# Patient Record
Sex: Female | Born: 1960 | Race: White | Hispanic: No | Marital: Single | Smoking: Current every day smoker
Health system: Southern US, Community
[De-identification: ages and names within clinical notes are randomized; demographics above are authoritative.]

## PROBLEM LIST (undated history)

## (undated) DIAGNOSIS — R42 Dizziness and giddiness: Secondary | ICD-10-CM

## (undated) DIAGNOSIS — K219 Gastro-esophageal reflux disease without esophagitis: Secondary | ICD-10-CM

## (undated) DIAGNOSIS — J449 Chronic obstructive pulmonary disease, unspecified: Secondary | ICD-10-CM

## (undated) DIAGNOSIS — G459 Transient cerebral ischemic attack, unspecified: Secondary | ICD-10-CM

## (undated) HISTORY — PX: DILATION AND CURETTAGE OF UTERUS: SHX78

## (undated) HISTORY — PX: BUNIONECTOMY: SHX129

## (undated) HISTORY — PX: TOTAL KNEE ARTHROPLASTY: SHX125

## (undated) HISTORY — PX: JOINT REPLACEMENT: SHX530

## (undated) HISTORY — PX: CERVICAL FUSION: SHX112

## (undated) HISTORY — PX: CERVICAL ABLATION: SHX5771

## (undated) HISTORY — PX: TONSILLECTOMY: SUR1361

---

## 1999-04-08 ENCOUNTER — Encounter: Payer: Self-pay | Admitting: Internal Medicine

## 1999-04-08 ENCOUNTER — Ambulatory Visit (HOSPITAL_COMMUNITY): Admission: RE | Admit: 1999-04-08 | Discharge: 1999-04-08 | Payer: Self-pay | Admitting: Nurse Practitioner

## 1999-08-31 ENCOUNTER — Other Ambulatory Visit: Admission: RE | Admit: 1999-08-31 | Discharge: 1999-08-31 | Payer: Self-pay | Admitting: Obstetrics and Gynecology

## 1999-09-21 ENCOUNTER — Other Ambulatory Visit: Admission: RE | Admit: 1999-09-21 | Discharge: 1999-09-21 | Payer: Self-pay | Admitting: Obstetrics and Gynecology

## 1999-09-21 ENCOUNTER — Encounter (INDEPENDENT_AMBULATORY_CARE_PROVIDER_SITE_OTHER): Payer: Self-pay | Admitting: Specialist

## 1999-11-17 ENCOUNTER — Emergency Department (HOSPITAL_COMMUNITY): Admission: EM | Admit: 1999-11-17 | Discharge: 1999-11-17 | Payer: Self-pay

## 2000-05-17 ENCOUNTER — Ambulatory Visit (HOSPITAL_COMMUNITY): Admission: RE | Admit: 2000-05-17 | Discharge: 2000-05-17 | Payer: Self-pay

## 2000-05-17 ENCOUNTER — Encounter: Payer: Self-pay | Admitting: Obstetrics and Gynecology

## 2002-04-21 ENCOUNTER — Ambulatory Visit (HOSPITAL_COMMUNITY): Admission: RE | Admit: 2002-04-21 | Discharge: 2002-04-21 | Payer: Self-pay | Admitting: Obstetrics and Gynecology

## 2003-03-31 ENCOUNTER — Other Ambulatory Visit: Admission: RE | Admit: 2003-03-31 | Discharge: 2003-03-31 | Payer: Self-pay | Admitting: Obstetrics and Gynecology

## 2003-04-28 ENCOUNTER — Encounter: Payer: Self-pay | Admitting: Neurosurgery

## 2003-04-30 ENCOUNTER — Ambulatory Visit (HOSPITAL_COMMUNITY): Admission: RE | Admit: 2003-04-30 | Discharge: 2003-05-01 | Payer: Self-pay | Admitting: Neurosurgery

## 2003-04-30 ENCOUNTER — Encounter: Payer: Self-pay | Admitting: Neurosurgery

## 2003-06-09 ENCOUNTER — Encounter: Admission: RE | Admit: 2003-06-09 | Discharge: 2003-06-09 | Payer: Self-pay | Admitting: Neurosurgery

## 2003-07-21 ENCOUNTER — Encounter: Admission: RE | Admit: 2003-07-21 | Discharge: 2003-07-21 | Payer: Self-pay | Admitting: Neurosurgery

## 2004-04-06 ENCOUNTER — Other Ambulatory Visit: Admission: RE | Admit: 2004-04-06 | Discharge: 2004-04-06 | Payer: Self-pay | Admitting: Obstetrics and Gynecology

## 2005-04-11 ENCOUNTER — Encounter: Admission: RE | Admit: 2005-04-11 | Discharge: 2005-04-11 | Payer: Self-pay | Admitting: Obstetrics and Gynecology

## 2005-04-25 ENCOUNTER — Encounter: Admission: RE | Admit: 2005-04-25 | Discharge: 2005-04-25 | Payer: Self-pay | Admitting: Obstetrics and Gynecology

## 2005-05-15 ENCOUNTER — Other Ambulatory Visit: Admission: RE | Admit: 2005-05-15 | Discharge: 2005-05-15 | Payer: Self-pay | Admitting: Obstetrics and Gynecology

## 2008-08-01 ENCOUNTER — Emergency Department (HOSPITAL_BASED_OUTPATIENT_CLINIC_OR_DEPARTMENT_OTHER): Admission: EM | Admit: 2008-08-01 | Discharge: 2008-08-01 | Payer: Self-pay | Admitting: Emergency Medicine

## 2010-12-30 NOTE — Op Note (Signed)
   NAME:  ANALEIGHA, NAUMAN                     ACCOUNT NO.:  [id number]   MEDICAL RECORD NO.:  [id number]                   PATIENT TYPE:  AMB   LOCATION:  SDC                                  FACILITY:  [facility]   PHYSICIAN:  Duke Salvia. Marcelle Overlie, M.D.            DATE OF BIRTH:  12/25/1960   DATE OF PROCEDURE:  DATE OF DISCHARGE:                                 OPERATIVE REPORT   PREOPERATIVE DIAGNOSIS:  Menorrhagia.   </POSTOPERATIVE DIAGNOSIS/  Menorrhagia.   PROCEDURE:  Cryoablation.   SURGEON:  Duke Salvia. Marcelle Overlie, M.D.   ANESTHESIA:  Paracervical block plus MAC.   DESCRIPTION OF PROCEDURE:  The patient  was taken to the operating room  after an adequate level of MAC was obtained.  The patient was sedated.  The  legs placed in the stirrups, prepped and draped in the normal fashion for a  vaginal procedure.  The bladder was drained.  Examination under anesthesia  was carried out.  The uterus was mid position, anterior, normal size.  Adnexa negative.  The cervix was grasped with the tenaculum.  A paracervical  block was created.  We will infiltrated at 3 and 9 o'clock submucosally, 5-7  cc of 1% Xylocaine on either side, with negative aspiration.  The uterus  then sounded to 10 cm, progressively dilated to a 29 Pratt.  The cryoprobe  was then positioned in the right cornu at the appropriate depth, and a six  minute free cycle was carried out.  This was thawed.  The cryoprobe was  removed and repositioned into the left cornu without difficulty again at the  appropriate depth, 9-10 cm, and another six minute cryoablation cycle was  performed without complications.  She tolerated this well and went to the  recovery room in good condition.                                               Richard M. Marcelle Overlie, M.D.    RMH/MEDQ  D:  04/21/2002  T:  04/21/2002  Job:  [id number]

## 2010-12-30 NOTE — H&P (Signed)
   NAME:  Tina Herring, Tina Herring                     ACCOUNT NO.:  [id number]   MEDICAL RECORD NO.:  [id number]                   PATIENT TYPE:  AMB   LOCATION:  SDC                                  FACILITY:  [facility]   PHYSICIAN:  Duke Salvia. Marcelle Overlie, M.D.            DATE OF BIRTH:  07-13-1961   DATE OF ADMISSION:  04/21/2002  DATE OF DISCHARGE:                                HISTORY & PHYSICAL   CHIEF COMPLAINT:  Menorrhagia.   HISTORY OF PRESENT ILLNESS:  A 50 year old G 2, P 2, prior tubal ligation  who has a several-year history of heavy irregular bleeding.  In February  2001, she underwent D&C/hysteroscopy.  Pathology showed benign secretory  endometrium.  Hysteroscopy showing a normal cavity at the end of the  procedure.   She continues to have heavy irregular bleeding and some midline dysmenorrhea  during her period.  We had discussed a number of options including a trial  of oral contraceptive pills which she has declined.  She presents now for  cryo ablation.  This procedure including the risks of bleeding, infection,  other complications such as perforation which may require additional surgery  were reviewed.  The 30% to 40% amenorrhea rate, 75% hypomenorrhea rate all  reviewed with her which she understands and accepts.   ALLERGIES:  MORPHINE.   OPERATION:  1. Right knee arthroscopy.  2. Left knee arthroscopic repair.  3. Tonsillectomy.  4. Cesarean section times two.  5. Tubal ligation.  6. Dilatation and curettage.  7. Hysteroscopy.   FAMILY HISTORY:  Family history significant for a sister with breast cancer.  Mother with gastric cancer.  Father with emphysema.  She smokes 1/2 pack per  day.   MEDICATIONS:  No current medicine except for vitamins, calcium and  Wellbutrin.   PHYSICAL EXAMINATION:  Temperature 98.2.  Blood pressure 120/62.  HEENT:  Unremarkable. Neck supple without masses. No sclera.  CARDIOVASCULAR:  Regular rate and rhythm without murmurs, rubs or  gallops.  BREASTS:  Without masses.  ABDOMEN:  Soft, flat and nontender.  PELVIC:  Normal external genitalia.  Cervix clear. Uterus normal size.  Adnexa negative.   IMPRESSION:  Menorrhagia.    PLAN:  Cryo ablation.   Procedure and risks reviewed as above.                                               Richard M. Marcelle Overlie, M.D.    RMH/MEDQ  D:  04/16/2002  T:  04/16/2002  Job:  [id number]

## 2010-12-30 NOTE — Op Note (Signed)
NAME:  Tina Herring, Tina Herring                     ACCOUNT NO.:  [id number]   MEDICAL RECORD NO.:  [id number]                   PATIENT TYPE:  OIB   LOCATION:  3010                                 FACILITY:  [facility]   PHYSICIAN:  Donalee Citrin, M.D.                     DATE OF BIRTH:  [birth year]   DATE OF PROCEDURE:  04/30/2003  DATE OF DISCHARGE:                                 OPERATIVE REPORT   PREOPERATIVE DIAGNOSIS:  Left C7 radiculopathy from large ruptured disk, C6-  7 left.   POSTOPERATIVE DIAGNOSIS:  Left C7 radiculopathy from large ruptured disk, C6-  7 left.   OPERATION PERFORMED:  Anterior cervical diskectomy and fusion at C6-7 using  a 7mm patellar wedge and a 25 mm Atlantis plate.   SURGEON:  Donalee Citrin, M.D.   ASSISTANT:  Tia Alert, MD   ANESTHESIA:  General endotracheal.   INDICATIONS FOR PROCEDURE:  The patient is a very pleasant 50 year old  female who has had longstanding neck and left arm pain radiating down to her  triceps into the first two fingers of her left hand.  The patient has been  refractory to conservative treatment with manipulation and therapy and anti-  inflammatories.  Preoperative imaging showed a very large ruptured disk at  approximately the left C7 nerve root starting preforaminally and extending  into the foramen.  The patient was recommended anterior cervical diskectomy  and fusion.  I extensively went over the risks and benefits of surgery with  her.  She understands and agreed to proceed forward.   DESCRIPTION OF PROCEDURE:  The patient was brought to the operating room was  induced under general anesthesia, placed supine with neck in slight  extension with five pounds of halter traction.  The right side of the neck  was prepped and draped in the usual sterile fashion after preoperative x-ray  localized the C6-7 disk space.  A curvilinear incision was made just off the  midline extended toward the sternocleidomastoid muscle and the  superficial  layer of the platysmas was dissected out and divided longitudinally.  The  avascular plane between the sternocleidomastoid muscle and the strap muscles  was developed down to prevertebral fascia.  The prevertebral fascia was  incised with Kitners.  Then intraoperative x-ray confirmed localization of  the needle in the C5-6 disk space.  Then the longus colli was reflected  laterally from the disk space below this and an annulotomy was made with a  15 blade scalpel.  Pituitary rongeurs were used to remove the anterior  margin of the annulus to mark the disk space.  Then the self-retaining  retractor was placed and the remainder of the interspace was incised.  Parts  of this were noted to be partially calcified.  This was all incised and  removed with pituitary rongeurs.  Then a high speed drill was used to drill  down the disk  space to the posterior annulus and posterior longitudinal  ligament.  There was noted to be a large free fragment disk that migrated  subligamentous and this was teased away with a black nerve hook exposing the  thecal sac and the remainder of the posterior longitudinal ligament was  removed in piecemeal fashion.  The __________ several large fragments of  disk were removed from within the foramen and the C7 neural foramen was  widely opened without difficulty. Then the remainder of the hypertrophied  ligament was removed with 1 and 2 mm Kerrison punches and the right C7 nerve  root was palpated out in its foramen with angled nerve hook noting no  further stenosis.  Then the wound was copiously irrigated.  Meticulous  hemostasis was maintained.  The end plates were scraped and prepared to  receive the bone graft and a 7 mm patellar wedge was sized, selected and  inserted approximately 1 mm deep to the anterior vertebral body line.  Postoperative fluoroscopy confirmed good position of the bone graft.  Then a  25 mm Atlantis plate was sized, selected and  inserted with four 13 mm  variable angle screws which were drilled, tapped and all screws had  excellent purchase.  This was tightened down.  The wound was copiously  irrigated and meticulous hemostasis was maintained.  The platysma was  reapproximated with 3-0 interrupted Vicryl and the skin was closed with  running 4-0 subcuticular.  Benzoin and Steri-Strips were applied.  The  patient was then transferred to the recovery room in stable condition.  At  the end of the case, sponge, needle and instrument counts were correct.                                               Donalee Citrin, M.D.    GC/MEDQ  D:  04/30/2003  T:  04/30/2003  Job:  [id number]

## 2011-05-19 LAB — URINE MICROSCOPIC-ADD ON

## 2011-05-19 LAB — URINALYSIS, ROUTINE W REFLEX MICROSCOPIC
Bilirubin Urine: NEGATIVE
Ketones, ur: NEGATIVE mg/dL
Protein, ur: NEGATIVE mg/dL
Specific Gravity, Urine: 1.024 (ref 1.005–1.030)
pH: 6 (ref 5.0–8.0)

## 2011-05-19 LAB — URINE CULTURE: Colony Count: >=100000

## 2014-04-22 ENCOUNTER — Ambulatory Visit: Payer: Self-pay | Admitting: Family Medicine

## 2015-06-28 ENCOUNTER — Emergency Department (HOSPITAL_COMMUNITY): Payer: Federal, State, Local not specified - PPO

## 2015-06-28 ENCOUNTER — Emergency Department (HOSPITAL_COMMUNITY)
Admission: EM | Admit: 2015-06-28 | Discharge: 2015-06-28 | Disposition: A | Discharge status: Home / Self Care | Payer: Federal, State, Local not specified - PPO | Attending: Emergency Medicine | Admitting: Emergency Medicine

## 2015-06-28 ENCOUNTER — Encounter (HOSPITAL_COMMUNITY): Payer: Self-pay | Admitting: Emergency Medicine

## 2015-06-28 DIAGNOSIS — R1031 Right lower quadrant pain: Secondary | ICD-10-CM | POA: Diagnosis present

## 2015-06-28 DIAGNOSIS — F17200 Nicotine dependence, unspecified, uncomplicated: Secondary | ICD-10-CM | POA: Insufficient documentation

## 2015-06-28 DIAGNOSIS — E663 Overweight: Secondary | ICD-10-CM | POA: Diagnosis not present

## 2015-06-28 DIAGNOSIS — Z8673 Personal history of transient ischemic attack (TIA), and cerebral infarction without residual deficits: Secondary | ICD-10-CM | POA: Diagnosis not present

## 2015-06-28 DIAGNOSIS — Z7982 Long term (current) use of aspirin: Secondary | ICD-10-CM | POA: Diagnosis not present

## 2015-06-28 DIAGNOSIS — K219 Gastro-esophageal reflux disease without esophagitis: Secondary | ICD-10-CM | POA: Insufficient documentation

## 2015-06-28 DIAGNOSIS — Z79899 Other long term (current) drug therapy: Secondary | ICD-10-CM | POA: Insufficient documentation

## 2015-06-28 HISTORY — DX: Transient cerebral ischemic attack, unspecified: G45.9

## 2015-06-28 HISTORY — DX: Gastro-esophageal reflux disease without esophagitis: K21.9

## 2015-06-28 LAB — COMPREHENSIVE METABOLIC PANEL
ALK PHOS: 67 U/L (ref 38–126)
ALT: 29 U/L (ref 14–54)
ANION GAP: 10 (ref 5–15)
AST: 28 U/L (ref 15–41)
Albumin: 4.1 g/dL (ref 3.5–5.0)
BILIRUBIN TOTAL: 0.6 mg/dL (ref 0.3–1.2)
BUN: 16 mg/dL (ref 6–20)
CALCIUM: 9.3 mg/dL (ref 8.9–10.3)
CO2: 26 mmol/L (ref 22–32)
CREATININE: 0.78 mg/dL (ref 0.44–1.00)
Chloride: 104 mmol/L (ref 101–111)
GFR calc non Af Amer: >60 mL/min (ref >60)
Glucose, Bld: 91 mg/dL (ref 65–99)
Potassium: 3.5 mmol/L (ref 3.5–5.1)
Sodium: 140 mmol/L (ref 135–145)
TOTAL PROTEIN: 6.8 g/dL (ref 6.5–8.1)

## 2015-06-28 LAB — CBC WITH DIFFERENTIAL/PLATELET
Basophils Absolute: 0 10*3/uL (ref 0.0–0.1)
Basophils Relative: 0 %
EOS ABS: 0.1 10*3/uL (ref 0.0–0.7)
Eosinophils Relative: 1 %
HEMATOCRIT: 39.2 % (ref 36.0–46.0)
HEMOGLOBIN: 13 g/dL (ref 12.0–15.0)
LYMPHS ABS: 1.3 10*3/uL (ref 0.7–4.0)
LYMPHS PCT: 23 %
MCH: 30.3 pg (ref 26.0–34.0)
MCHC: 33.2 g/dL (ref 30.0–36.0)
MCV: 91.4 fL (ref 78.0–100.0)
MONOS PCT: 10 %
Monocytes Absolute: 0.6 10*3/uL (ref 0.1–1.0)
NEUTROS ABS: 3.7 10*3/uL (ref 1.7–7.7)
NEUTROS PCT: 66 %
Platelets: 224 10*3/uL (ref 150–400)
RBC: 4.29 MIL/uL (ref 3.87–5.11)
RDW: 13.4 % (ref 11.5–15.5)
WBC: 5.6 10*3/uL (ref 4.0–10.5)

## 2015-06-28 LAB — URINALYSIS, ROUTINE W REFLEX MICROSCOPIC
BILIRUBIN URINE: NEGATIVE
Glucose, UA: NEGATIVE mg/dL
HGB URINE DIPSTICK: NEGATIVE
KETONES UR: 15 mg/dL — AB
Leukocytes, UA: NEGATIVE
NITRITE: NEGATIVE
PH: 6 (ref 5.0–8.0)
Protein, ur: NEGATIVE mg/dL
Specific Gravity, Urine: 1.008 (ref 1.005–1.030)
UROBILINOGEN UA: 0.2 mg/dL (ref 0.0–1.0)

## 2015-06-28 LAB — WET PREP, GENITAL
Clue Cells Wet Prep HPF POC: NONE SEEN
TRICH WET PREP: NONE SEEN
YEAST WET PREP: NONE SEEN

## 2015-06-28 MED ORDER — OXYCODONE-ACETAMINOPHEN 5-325 MG PO TABS: 1.0000 | ORAL_TABLET | Freq: Four times a day (QID) | ORAL | Status: DC | PRN

## 2015-06-28 MED ORDER — SODIUM CHLORIDE 0.9 % IV BOLUS (SEPSIS)
1000.0000 mL | Freq: Once | INTRAVENOUS | Status: AC
  Administered 2015-06-28: 1000 mL via INTRAVENOUS

## 2015-06-28 MED ORDER — FENTANYL CITRATE (PF) 100 MCG/2ML IJ SOLN
100.0000 ug | Freq: Once | INTRAMUSCULAR | Status: AC
  Administered 2015-06-28: 100 ug via INTRAVENOUS
  Filled 2015-06-28: qty 2

## 2015-06-28 MED ORDER — ONDANSETRON HCL 4 MG/2ML IJ SOLN
4.0000 mg | Freq: Once | INTRAMUSCULAR | Status: AC
  Administered 2015-06-28: 4 mg via INTRAVENOUS
  Filled 2015-06-28: qty 2

## 2015-06-28 MED ORDER — HYDROMORPHONE HCL 1 MG/ML IJ SOLN
0.5000 mg | Freq: Once | INTRAMUSCULAR | Status: AC
Start: 2015-06-28 — End: 2015-06-28
  Administered 2015-06-28: 0.5 mg via INTRAVENOUS
  Filled 2015-06-28: qty 1

## 2015-06-28 MED ORDER — IOHEXOL 300 MG/ML  SOLN
100.0000 mL | Freq: Once | INTRAMUSCULAR | Status: AC | PRN
  Administered 2015-06-28: 100 mL via INTRAVENOUS

## 2015-06-28 NOTE — ED Notes (Signed)
Pt returned from US

## 2015-06-28 NOTE — ED Notes (Signed)
Pt in US

## 2015-06-28 NOTE — Discharge Instructions (Signed)
You were seen today for abdominal and groin pain. Your ultrasound and CT scan are reassuring. The cause of your pain at this time is unknown. It could be musculoskeletal in nature. He will be given pain medications. If your pain worsened she should be reevaluated.  Abdominal Pain, Adult Many things can cause abdominal pain. Usually, abdominal pain is not caused by a disease and will improve without treatment. It can often be observed and treated at home. Your health care provider will do a physical exam and possibly order blood tests and X-rays to help determine the seriousness of your pain. However, in many cases, more time must pass before a clear cause of the pain can be found. Before that point, your health care provider may not know if you need more testing or further treatment. HOME CARE INSTRUCTIONS Monitor your abdominal pain for any changes. The following actions may help to alleviate any discomfort you are experiencing:  Only take over-the-counter or prescription medicines as directed by your health care provider.  Do not take laxatives unless directed to do so by your health care provider.  Try a clear liquid diet (broth, tea, or water) as directed by your health care provider. Slowly move to a bland diet as tolerated. SEEK MEDICAL CARE IF:  You have unexplained abdominal pain.  You have abdominal pain associated with nausea or diarrhea.  You have pain when you urinate or have a bowel movement.  You experience abdominal pain that wakes you in the night.  You have abdominal pain that is worsened or improved by eating food.  You have abdominal pain that is worsened with eating fatty foods.  You have a fever. SEEK IMMEDIATE MEDICAL CARE IF:  Your pain does not go away within 2 hours.  You keep throwing up (vomiting).  Your pain is felt only in portions of the abdomen, such as the right side or the left lower portion of the abdomen.  You pass bloody or black tarry  stools. MAKE SURE YOU:  Understand these instructions.  Will watch your condition.  Will get help right away if you are not doing well or get worse.   This information is not intended to replace advice given to you by your health care provider. Make sure you discuss any questions you have with your health care provider.   Document Released: 05/10/2005 Document Revised: 04/21/2015 Document Reviewed: 04/09/2013 Elsevier Interactive Patient Education [employer]2016 [employer] Inc[employer]

## 2015-06-28 NOTE — ED Provider Notes (Signed)
CSN: [id number]   Arrival date & time 06/28/15 4098  History  By signing my name below, I, Tina Herring, attest that this documentation has been prepared under the direction and in the presence of Shon Baton, MD. Electronically Signed: Bethel Herring, ED Scribe. 06/28/2015. 4:02 AM.  Chief Complaint  Patient presents with  . Abdominal Pain  . Groin Pain    HPI The history is provided by the patient. No language interpreter was used.   Tina Herring is a 54 y.o. female who presents to the Emergency Department complaining of intermittent right lower quadrant and groin pain with onset yesterday. The pain worsened today around 3 AM and radiates across the groin and lower abdomen. She rates the pain 4/10 in severity noting that it is exacerbated by walking and movement. She used hydrocodone last night around 7 PM. The hydrocodone and 800 mg if ibuprofen the day before provided insufficient relief in pain at home.  Pt denies fever, chills, nausea, vomiting, dysuria, hematuria, difficulty urinating, and increased frequency.  Denies injury.   Past Medical History  Diagnosis Date  . TIA (transient ischemic attack)   . GERD (gastroesophageal reflux disease)     Past Surgical History  Procedure Laterality Date  . Total knee arthroplasty Bilateral   . Tonsillectomy    . Cervical fusion    . Cesarean section    . Bunionectomy    . Cervical ablation    . Dilation and curettage of uterus      No family history on file.  Social History  Substance Use Topics  . Smoking status: Current Every Day Smoker -- 0.25 packs/day    Types: Cigarettes  . Smokeless tobacco: None  . Alcohol Use: No     Review of Systems  Constitutional: Negative for fever and chills.  Gastrointestinal: Positive for abdominal pain. Negative for nausea and vomiting.  Genitourinary: Positive for pelvic pain. Negative for dysuria, hematuria and difficulty urinating.  All other systems reviewed and are  negative.  Home Medications   Prior to Admission medications   Medication Sig Start Date End Date Taking? Authorizing Provider  aspirin 81 MG tablet Take 81 mg by mouth daily.   Yes Historical Provider, MD  GARLIC PO Take 1 tablet by mouth daily.   Yes Historical Provider, MD  Multiple Vitamin (MULTIVITAMIN WITH MINERALS) TABS tablet Take 1 tablet by mouth daily.   Yes Historical Provider, MD  nystatin (MYCOSTATIN/NYSTOP) 100000 UNIT/GM POWD Apply 1 g topically 2 (two) times daily as needed (rash).   Yes Historical Provider, MD  Omega-3 Fatty Acids (FISH OIL) 1000 MG CAPS Take 1,000 mg by mouth daily.   Yes Historical Provider, MD  omeprazole (PRILOSEC) 20 MG capsule Take 20 mg by mouth daily.   Yes Historical Provider, MD  oxyCODONE-acetaminophen (PERCOCET/ROXICET) 5-325 MG tablet Take 1-2 tablets by mouth every 6 (six) hours as needed for severe pain. 06/28/15   Shon Baton, MD    Allergies  Shellfish allergy and Morphine and related  Triage Vitals: BP 135/84 mmHg  Pulse 95  Temp(Src) 98.6 F (37 C) (Oral)  Resp 20  SpO2 99%  Physical Exam  Constitutional: She is oriented to person, place, and time. No distress.  Overweight  HENT:  Head: Normocephalic and atraumatic.  Cardiovascular: Normal rate, regular rhythm and normal heart sounds.   No murmur heard. Pulmonary/Chest: Effort normal and breath sounds normal. No respiratory distress. She has no wheezes.  Abdominal: Soft. Bowel sounds are normal. There  is tenderness. There is no rebound and no guarding.  Tenderness to palpation in the right lower quadrant, no rebound or guarding, pain extends into the right inguinal area, negative Rovsing's  Genitourinary: Vagina normal.  Pain with introduction of speculum, right adnexal tenderness without mass, no cervical motion tenderness scant vaginal discharge  Musculoskeletal: Normal range of motion. She exhibits no edema.  Normal range of motion of the right hip  Neurological:  She is alert and oriented to person, place, and time.  Skin: Skin is warm and dry.  Psychiatric: She has a normal mood and affect.  Nursing note and vitals reviewed.   ED Course  Procedures   DIAGNOSTIC STUDIES: Oxygen Saturation is 99% on RA, normal by my interpretation.    COORDINATION OF CARE: 3:58 AM Discussed treatment plan which includes lab work and pain management with pt at bedside and pt agreed to plan.  Labs Reviewed  WET PREP, GENITAL - Abnormal; Notable for the following:    WBC, Wet Prep HPF POC FEW (*)    All other components within normal limits  URINALYSIS, ROUTINE W REFLEX MICROSCOPIC (NOT AT [facility]) - Abnormal; Notable for the following:    Ketones, ur 15 (*)    All other components within normal limits  CBC WITH DIFFERENTIAL/PLATELET  COMPREHENSIVE METABOLIC PANEL    Imaging Review US Transvaginal Non-ob  06/28/2015  CLINICAL DATA:  Right lower quadrant pain. EXAM: TRANSABDOMINAL AND TRANSVAGINAL ULTRASOUND OF PELVIS DOPPLER ULTRASOUND OF OVARIES TECHNIQUE: Both transabdominal and transvaginal ultrasound examinations of the pelvis were performed. Transabdominal technique was performed for global imaging of the pelvis including uterus, ovaries, adnexal regions, and pelvic cul-de-sac. It was necessary to proceed with endovaginal exam following the transabdominal exam to visualize the ovaries. Color and duplex Doppler ultrasound was utilized to evaluate blood flow to the ovaries. COMPARISON:  CT scan of the abdomen and pelvis dated 06/28/2015 FINDINGS: Uterus Measurements: 4.5 x 2.6 x 3.7 cm. No fibroids or other mass visualized. Endometrium Thickness: 2.3 mm.  No focal abnormality visualized. Right ovary Measurements: 1.9 x 1.7 x 0.4 cm. Normal appearance/no adnexal mass. Left ovary Not visualized. The ovary appears normal on the CT scan done earlier on the same date. Pulsed Doppler evaluation of the right ovary demonstrates normal low resistance arterial and venous  perfusion. Other findings No free fluid. IMPRESSION: Normal uterus and right ovary with normal perfusion to the right ovary. Left ovary was not identified. Electronically Signed   By: Francene Boyers M.D.   On: 06/28/2015 07:43   US Pelvis Complete  06/28/2015  CLINICAL DATA:  Right lower quadrant pain. EXAM: TRANSABDOMINAL AND TRANSVAGINAL ULTRASOUND OF PELVIS DOPPLER ULTRASOUND OF OVARIES TECHNIQUE: Both transabdominal and transvaginal ultrasound examinations of the pelvis were performed. Transabdominal technique was performed for global imaging of the pelvis including uterus, ovaries, adnexal regions, and pelvic cul-de-sac. It was necessary to proceed with endovaginal exam following the transabdominal exam to visualize the ovaries. Color and duplex Doppler ultrasound was utilized to evaluate blood flow to the ovaries. COMPARISON:  CT scan of the abdomen and pelvis dated 06/28/2015 FINDINGS: Uterus Measurements: 4.5 x 2.6 x 3.7 cm. No fibroids or other mass visualized. Endometrium Thickness: 2.3 mm.  No focal abnormality visualized. Right ovary Measurements: 1.9 x 1.7 x 0.4 cm. Normal appearance/no adnexal mass. Left ovary Not visualized. The ovary appears normal on the CT scan done earlier on the same date. Pulsed Doppler evaluation of the right ovary demonstrates normal low resistance arterial and venous perfusion.  Other findings No free fluid. IMPRESSION: Normal uterus and right ovary with normal perfusion to the right ovary. Left ovary was not identified. Electronically Signed   By: Francene Boyers M.D.   On: 06/28/2015 07:43   Ct Abdomen Pelvis W Contrast  06/28/2015  CLINICAL DATA:  Right lower abdominal pain radiating to right flank. Onset yesterday, progressive. EXAM: CT ABDOMEN AND PELVIS WITH CONTRAST TECHNIQUE: Multidetector CT imaging of the abdomen and pelvis was performed using the standard protocol following bolus administration of intravenous contrast. CONTRAST:  OMNIPAQUE IOHEXOL 300  MG/ML  SOLN COMPARISON:  None. FINDINGS: Lower chest:  The included lung bases are clear. Liver: Decreased density consistent with steatosis. No focal lesion. Hepatobiliary: The gallbladder physiologically distended, no calcified stone. No pericholecystic inflammatory change. No biliary dilatation. Pancreas: Normal. Spleen: Normal. Adrenal glands: No nodule. Kidneys: Symmetric renal enhancement and excretion. No hydronephrosis. Extrarenal pelvis configuration of the right kidney, no hydronephrosis. No focal renal abnormality. Stomach/Bowel: Stomach physiologically distended. There are no dilated or thickened small bowel loops. Diverticulosis throughout the mid and distal colon, with large diverticular burden in the sigmoid colon. No diverticulitis. Small volume of stool throughout the colon without colonic wall thickening. The appendix is normal. Vascular/Lymphatic: No retroperitoneal adenopathy. Abdominal aorta is normal in caliber. Reproductive: Uterus and adnexa normal for age. Bladder: Physiologically distended. Minimal perivesicular stranding about the dome. Wall thickening. Other: Small fat containing umbilical hernia. Musculoskeletal: There are no acute or suspicious osseous abnormalities. Facet arthropathy in the lower lumbar spine. Degenerative disc disease at L5-S1 and L4-L5. IMPRESSION: 1. No acute abnormality in the abdomen/pelvis. 2. Multifocal colonic diverticulosis with large diverticular burden in the sigmoid colon. No diverticulitis. 3. Incidental finding of hepatic steatosis. Electronically Signed   By: Rubye Oaks M.D.   On: 06/28/2015 06:38   [place] Art/ven Flow Abd Pelv Doppler  06/28/2015  CLINICAL DATA:  Right lower quadrant pain. EXAM: TRANSABDOMINAL AND TRANSVAGINAL ULTRASOUND OF PELVIS DOPPLER ULTRASOUND OF OVARIES TECHNIQUE: Both transabdominal and transvaginal ultrasound examinations of the pelvis were performed. Transabdominal technique was performed for global imaging of the pelvis  including uterus, ovaries, adnexal regions, and pelvic cul-de-sac. It was necessary to proceed with endovaginal exam following the transabdominal exam to visualize the ovaries. Color and duplex Doppler ultrasound was utilized to evaluate blood flow to the ovaries. COMPARISON:  CT scan of the abdomen and pelvis dated 06/28/2015 FINDINGS: Uterus Measurements: 4.5 x 2.6 x 3.7 cm. No fibroids or other mass visualized. Endometrium Thickness: 2.3 mm.  No focal abnormality visualized. Right ovary Measurements: 1.9 x 1.7 x 0.4 cm. Normal appearance/no adnexal mass. Left ovary Not visualized. The ovary appears normal on the CT scan done earlier on the same date. Pulsed Doppler evaluation of the right ovary demonstrates normal low resistance arterial and venous perfusion. Other findings No free fluid. IMPRESSION: Normal uterus and right ovary with normal perfusion to the right ovary. Left ovary was not identified. Electronically Signed   By: Francene Boyers M.D.   On: 06/28/2015 07:43    I personally reviewed and evaluated these lab results as a part of my medical decision-making.   MDM   Final diagnoses:  RLQ abdominal pain    Patient presents with right lower quadrant and right groin pain. Ongoing and worsening over the last 24 hours. No other significant associated symptoms. She is tender in her right lower quadrant and right groin. Considerations include appendicitis, ovarian pathology, or musculoskeletal. Patient appears uncomfortable. She was given pain and nausea medication.  Basic lab results obtained and largely reassuring including urinalysis. CT scan of the abdomen obtained to rule out appendicitis. This is negative. Patient continues to endorse increasing pain. Pelvic exam with tenderness over the right adnexa. Ovarian torsion would be a consideration. Ultrasound obtained and reassuring.  Discussed the result with the patient. At this time, with normal imaging her pain may be musculoskeletal in nature.  Given her age, absence of known injury, and tenderness into the abdomen, would have lower suspicion for occult hip fracture; however, discussed with the patient if her pain progresses or worsens she needs to be reevaluated. Will be discharged with a short course of pain medication. Patient was given strict return cautions.  After history, exam, and medical workup I feel the patient has been appropriately medically screened and is safe for discharge home. Pertinent diagnoses were discussed with the patient. Patient was given return precautions.  I personally performed the services described in this documentation, which was scribed in my presence. The recorded information has been reviewed and is accurate.    Shon Batonourtney F Emberlie Gotcher, MD 06/29/15 [phone]

## 2015-06-28 NOTE — ED Notes (Signed)
Pt arrives with c/o R lower abdominal pain radiating to the R flank, woke up with it yesterday and got better throughout the day, then got worse after she went to bed. This morning pt states she could barely walk d/t pain. Denies urinary s/s.

## 2016-09-02 ENCOUNTER — Emergency Department (HOSPITAL_BASED_OUTPATIENT_CLINIC_OR_DEPARTMENT_OTHER)
Admission: EM | Admit: 2016-09-02 | Discharge: 2016-09-02 | Disposition: A | Discharge status: Home / Self Care | Attending: Emergency Medicine | Admitting: Emergency Medicine

## 2016-09-02 ENCOUNTER — Encounter (HOSPITAL_BASED_OUTPATIENT_CLINIC_OR_DEPARTMENT_OTHER): Payer: Self-pay | Admitting: *Deleted

## 2016-09-02 ENCOUNTER — Emergency Department (HOSPITAL_BASED_OUTPATIENT_CLINIC_OR_DEPARTMENT_OTHER)

## 2016-09-02 DIAGNOSIS — Y999 Unspecified external cause status: Secondary | ICD-10-CM | POA: Insufficient documentation

## 2016-09-02 DIAGNOSIS — W010XXA Fall on same level from slipping, tripping and stumbling without subsequent striking against object, initial encounter: Secondary | ICD-10-CM | POA: Insufficient documentation

## 2016-09-02 DIAGNOSIS — S6391XA Sprain of unspecified part of right wrist and hand, initial encounter: Secondary | ICD-10-CM | POA: Diagnosis not present

## 2016-09-02 DIAGNOSIS — Z7982 Long term (current) use of aspirin: Secondary | ICD-10-CM | POA: Diagnosis not present

## 2016-09-02 DIAGNOSIS — S6392XA Sprain of unspecified part of left wrist and hand, initial encounter: Secondary | ICD-10-CM | POA: Diagnosis not present

## 2016-09-02 DIAGNOSIS — Z79899 Other long term (current) drug therapy: Secondary | ICD-10-CM | POA: Insufficient documentation

## 2016-09-02 DIAGNOSIS — Y92481 Parking lot as the place of occurrence of the external cause: Secondary | ICD-10-CM | POA: Diagnosis not present

## 2016-09-02 DIAGNOSIS — Y9389 Activity, other specified: Secondary | ICD-10-CM | POA: Diagnosis not present

## 2016-09-02 DIAGNOSIS — S6991XA Unspecified injury of right wrist, hand and finger(s), initial encounter: Secondary | ICD-10-CM | POA: Diagnosis present

## 2016-09-02 DIAGNOSIS — F17210 Nicotine dependence, cigarettes, uncomplicated: Secondary | ICD-10-CM | POA: Insufficient documentation

## 2016-09-02 DIAGNOSIS — W19XXXA Unspecified fall, initial encounter: Secondary | ICD-10-CM

## 2016-09-02 MED ORDER — DICLOFENAC SODIUM 50 MG PO TBEC
50.0000 mg | DELAYED_RELEASE_TABLET | Freq: Two times a day (BID) | ORAL | 0 refills | Status: DC
Start: 2016-09-02 — End: 2017-05-09

## 2016-09-02 NOTE — ED Triage Notes (Signed)
Patient states she slipped coming out of work yesterday on the salt on the sidewalk.  Landed on her bilateral hands.  Today has bilateral wrist, hand and left arm pain.  No LOC.

## 2016-09-02 NOTE — ED Provider Notes (Signed)
[facility] Provider Note   CSN: [id number] Arrival date & time: 09/02/16  1138  By signing my name below, I, Tina Herring, attest that this documentation has been prepared under the direction and in the presence of  [facility] M. Damian Leavell, NP. Electronically Signed: Doreatha Herring, ED Scribe. 09/02/16. 4:14 PM.    History   Chief Complaint Chief Complaint  Patient presents with  . Fall    HPI Tina Herring is a 56 y.o. female who presents to the Emergency Department complaining of moderate right forearm and wrist pain s/p mechanical fall that occurred yesterday. Pt states she slipped on salt on the parking lot, fell forward and landed on her hands and knees. Pt denies LOC, head injury. She also notes a tingling sensation from her right thumb to her forearm that began after the injury. She states her pain is worsened with wrist movement. No alleviating factors noted. She denies ankle pain, additional injuries or complaints.   The history is provided by the patient. No language interpreter was used.  Fall  This is a new problem. The current episode started yesterday. The problem occurs constantly. The problem has not changed since onset.Pertinent negatives include no headaches. The symptoms are aggravated by bending. Nothing relieves the symptoms. She has tried nothing for the symptoms. The treatment provided no relief.    Past Medical History:  Diagnosis Date  . GERD (gastroesophageal reflux disease)   . TIA (transient ischemic attack)     There are no active problems to display for this patient.   Past Surgical History:  Procedure Laterality Date  . BUNIONECTOMY    . CERVICAL ABLATION    . CERVICAL FUSION    . CESAREAN SECTION    . DILATION AND CURETTAGE OF UTERUS    . JOINT REPLACEMENT Bilateral   . TONSILLECTOMY    . TOTAL KNEE ARTHROPLASTY Bilateral     OB History    No data available       Home Medications    Prior to Admission medications   Medication  Sig Start Date End Date Taking? Authorizing Provider  aspirin 81 MG tablet Take 81 mg by mouth daily.   Yes Historical Provider, MD  Fexofenadine HCl (ALLEGRA PO) Take by mouth.   Yes Historical Provider, MD  GARLIC PO Take 1 tablet by mouth daily.   Yes Historical Provider, MD  Multiple Vitamin (MULTIVITAMIN WITH MINERALS) TABS tablet Take 1 tablet by mouth daily.   Yes Historical Provider, MD  nystatin (MYCOSTATIN/NYSTOP) 100000 UNIT/GM POWD Apply 1 g topically 2 (two) times daily as needed (rash).   Yes Historical Provider, MD  Omega-3 Fatty Acids (FISH OIL) 1000 MG CAPS Take 1,000 mg by mouth daily.   Yes Historical Provider, MD  omeprazole (PRILOSEC) 20 MG capsule Take 20 mg by mouth daily.   Yes Historical Provider, MD  diclofenac (VOLTAREN) 50 MG EC tablet Take 1 tablet (50 mg total) by mouth 2 (two) times daily. 09/02/16   Hope Orlene Och, NP  oxyCODONE-acetaminophen (PERCOCET/ROXICET) 5-325 MG tablet Take 1-2 tablets by mouth every 6 (six) hours as needed for severe pain. 06/28/15   Shon Baton, MD    Family History No family history on file.  Social History Social History  Substance Use Topics  . Smoking status: Current Every Day Smoker    Packs/day: 0.25    Types: Cigarettes  . Smokeless tobacco: Never Used  . Alcohol use No     Allergies   Shellfish  allergy and Morphine and related   Review of Systems Review of Systems  Musculoskeletal: Positive for arthralgias.  Neurological: Positive for numbness. Negative for syncope and headaches.  All other systems reviewed and are negative.    Physical Exam Updated Vital Signs BP 129/64 (BP Location: Left Arm)   Pulse 66   Temp 98 F (36.7 C) (Oral)   Resp 18   Ht 5\' 3"  (1.6 m)   Wt 93.9 kg   SpO2 98%   BMI 36.67 kg/m   Physical Exam  Constitutional: She appears well-developed and well-nourished.  HENT:  Head: Normocephalic.  Eyes: Conjunctivae are normal.  Cardiovascular: Normal rate.   Radial pulses 2+  Adequate circulation.   Pulmonary/Chest: Effort normal. No respiratory distress.  Abdominal: She exhibits no distension.  Musculoskeletal: Normal range of motion. She exhibits tenderness.  Tenderness over the thenar area that radiated to the wrist of the RUE. FROM of the right digits. FROM and no tenderness on the left.   Neurological: She is alert.  Grips are equal.   Skin: Skin is warm and dry.  Ecchymosis to the right forearm   Psychiatric: She has a normal mood and affect. Her behavior is normal.  Nursing note and vitals reviewed.    ED Treatments / Results   DIAGNOSTIC STUDIES: Oxygen Saturation is 98% on RA, normal by my interpretation.    COORDINATION OF CARE: 4:10 PM Discussed treatment plan with pt at bedside which includes XR and pt agreed to plan.    Labs (all labs ordered are listed, but only abnormal results are displayed) Labs Reviewed - No data to display   Radiology Dg Wrist Complete Left  Result Date: 09/02/2016 CLINICAL DATA:  Pain after fall EXAM: LEFT WRIST - COMPLETE 3+ VIEW COMPARISON:  None. 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 Minimal soft tissue calcification in the triangular cartilage, less than seen on the right. There may be minimal soft tissue calcification along the dorsum of the wrist as well, also less than seen on the right. No acute fractures are seen on today's study. IMPRESSION: Soft tissue calcifications in the wrist as above, similar in distribution but less in quantity compared to the right. This raises the possibility of CPPD. No acute fractures identified. Electronically Signed   By: Gerome Samavid  Williams III M.D   On: 09/02/2016 15:34   Dg Wrist Complete Right  Result Date: 09/02/2016 CLINICAL DATA:  Pain after trauma EXAM: RIGHT WRIST - COMPLETE 3+ VIEW COMPARISON:  None. FINDINGS: Soft tissue calcifications are seen in the triangular cartilage distal to the  ulna and along the dorsal aspect of the carpal bones raising the possibility of CPPD. No acute fractures are identified. IMPRESSION: Soft tissue calcifications as above, possibly representing sequela of CPPD. No acute fractures noted. Electronically Signed   By: Gerome Samavid  Williams III M.D   On: 09/02/2016 15:32   Dg Hand Complete Right  Result Date: 09/02/2016 CLINICAL DATA:  Right hand pain. EXAM: RIGHT HAND - COMPLETE 3+ VIEW COMPARISON:  None. FINDINGS: Soft tissue calcification is seen posterior to the carpal bones on the lateral view. This is not the typical appearance of a triquetrum fracture. There is also soft tissue calcification in the triangular cartilage distal to the ulna. Lucencies in the capitate are likely bone cysts. No carpal bone fractures are identified. The remainder of the hand is normal in appearance. IMPRESSION: 1. Soft tissue calcifications posterior to the carpal bones  on the lateral view and in the triangular cartilage distal to the ulna are nonspecific but may be seen with CPPD. The calcification posterior to the carpal bones is not typical in appearance for a triquetrum fracture. No acute abnormality identified. Electronically Signed   By: Gerome Sam III M.D   On: 09/02/2016 15:29    Procedures Procedures (including critical care time)  Medications Ordered in ED Medications - No data to display   Initial Impression / Assessment and Plan / ED Course  I have reviewed the triage vital signs and the nursing notes.  Pertinent labs & imaging results that were available during my care of the patient were reviewed by me and considered in my medical decision making (see chart for details).  Patient X-Ray negative for obvious fracture or dislocation.  Pt advised to follow up with hand surgery. Patient given velcro thumb spica while in ED, conservative therapy recommended and discussed. Patient will be discharged home & is agreeable with above plan. Returns precautions  discussed. Pt appears safe for discharge.   Final Clinical Impressions(s) / ED Diagnoses   Final diagnoses:  Hand sprain, right, initial encounter  Hand sprain, left, initial encounter  Fall, initial encounter    New Prescriptions Discharge Medication List as of 09/02/2016  4:17 PM    START taking these medications   Details  diclofenac (VOLTAREN) 50 MG EC tablet Take 1 tablet (50 mg total) by mouth 2 (two) times daily., Starting Sat 09/02/2016, Print       I personally performed the services described in this documentation, which was scribed in my presence. The recorded information has been reviewed and is accurate.    [address] [place], [place] 09/03/16 1610    Rolan Bucco, MD 09/03/16 [phone]

## 2017-05-09 ENCOUNTER — Encounter: Payer: Self-pay | Admitting: Podiatry

## 2017-05-09 ENCOUNTER — Ambulatory Visit (INDEPENDENT_AMBULATORY_CARE_PROVIDER_SITE_OTHER): Payer: 59 | Admitting: Podiatry

## 2017-05-09 VITALS — BP 130/74 | HR 81 | Ht 63.0 in | Wt 212.0 lb

## 2017-05-09 DIAGNOSIS — M6700 Short Achilles tendon (acquired), unspecified ankle: Secondary | ICD-10-CM | POA: Diagnosis not present

## 2017-05-09 DIAGNOSIS — M722 Plantar fascial fibromatosis: Secondary | ICD-10-CM

## 2017-05-09 DIAGNOSIS — M216X9 Other acquired deformities of unspecified foot: Secondary | ICD-10-CM | POA: Diagnosis not present

## 2017-05-09 DIAGNOSIS — G8929 Other chronic pain: Secondary | ICD-10-CM

## 2017-05-09 DIAGNOSIS — M25571 Pain in right ankle and joints of right foot: Secondary | ICD-10-CM | POA: Diagnosis not present

## 2017-05-09 NOTE — Progress Notes (Signed)
     SUBJECTIVE: 56 y.o. year old female presents complaining of right foot pain at instep area x 1 week, right ankle pain x 8 months. Was treated with injection on right lateral ankle by orthopedist last November 2017. Having shooting pain like electricity going through from lateral ankle to anterior ankle of right foot. After been on feet all day, the foot becomes tingle and numbness on right foot more on instep area and also top and bottom of forefoot. Sits at work and on feet once gets home. She was referred by her husband and son.  REVIEW OF SYSTEMS: A comprehensive review of systems was negative except for: Knee replacement 2009 on right and 2015 on left. Right bunion surgery 15 years ago.  OBJECTIVE: DERMATOLOGIC EXAMINATION: No abnormal skin lesions. Thin mild keratotic skin under 4th MPJ bilateral.  VASCULAR EXAMINATION OF LOWER LIMBS: All pedal pulses are palpable with normal pulsation.  Capillary Filling times within 3 seconds in all digits.  Mild ankle edema right lateral ankle. Temperature gradient from tibial crest to dorsum of foot is within normal bilateral.  NEUROLOGIC EXAMINATION OF THE LOWER LIMBS: All epicritic and tactile sensations grossly intact. Sharp and Dull discriminatory sensations at the plantar ball of hallux is intact bilateral.   MUSCULOSKELETAL EXAMINATION: Positive for tight Achilles tendon bilateral, weak first metatarsal bone, compensatory hyperpronation right ankle.  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 Unstable first ray bilateral. Compensatory hyperpronation right. Plantar fasciitis right.  PLAN: Reviewed clinical findings and available treatment options. Reviewed stretch exercise to practice daily for tight Achilles tendon bilateral. May benefit from custom orthotics. May use ankle brace as needed. Return for orthotics.

## 2017-05-09 NOTE — Patient Instructions (Signed)
Seen for pain on right foot and ankle. Noted of tight Achilles tendon, unstable first metatarsal and compensatory excess pronation of STJ right foot. Need daily stretch exercise for tight Achille tendon as demonstrated. Need custom orthotics. May take time off as needed and take anti inflammatory medication as needed.

## 2017-05-15 ENCOUNTER — Ambulatory Visit (INDEPENDENT_AMBULATORY_CARE_PROVIDER_SITE_OTHER): Payer: 59 | Admitting: Podiatry

## 2017-05-15 DIAGNOSIS — G8929 Other chronic pain: Secondary | ICD-10-CM

## 2017-05-15 DIAGNOSIS — M216X9 Other acquired deformities of unspecified foot: Secondary | ICD-10-CM

## 2017-05-15 DIAGNOSIS — M722 Plantar fascial fibromatosis: Secondary | ICD-10-CM

## 2017-05-15 DIAGNOSIS — M25571 Pain in right ankle and joints of right foot: Secondary | ICD-10-CM

## 2017-05-15 DIAGNOSIS — M6700 Short Achilles tendon (acquired), unspecified ankle: Secondary | ICD-10-CM

## 2017-05-15 NOTE — Patient Instructions (Addendum)
Both feet casted for orthotics. Still having pain on right lateral ankle and plantar fascia. May benefit from a new ankle brace. One ankle brace dispensed with instruction. Will call when they are ready.

## 2017-05-15 NOTE — Progress Notes (Signed)
SUBJECTIVE: 56 y.o. year old female presents complaining of pain on right lateral ankle and instep area.  HPI: Right foot pain at instep area x 2-3 week, right ankle pain over 8 months. Was treated with injection on right lateral ankle by orthopedist last November 2017. Having shooting pain like electricity going through from lateral ankle to anterior ankle of right foot. After been on feet all day, the foot becomes tingle and numbness on right foot more on instep area and also top and bottom of forefoot. Sits at work and on feet once gets home. She was referred by her husband and son.  REVIEW OF SYSTEMS: A comprehensive review of systems was negative except for: Knee replacement 2009 on right and 2015 on left. Right bunion surgery 15 years ago.  OBJECTIVE: DERMATOLOGIC EXAMINATION: No abnormal skin lesions. Thin mild keratotic skin under 4th MPJ bilateral.  VASCULAR EXAMINATION OF LOWER LIMBS: All pedal pulses are palpable with normal pulsation.  Capillary Filling times within 3 seconds in all digits.  Mild ankle edema right lateral ankle. Temperature gradient from tibial crest to dorsum of foot is within normal bilateral.  NEUROLOGIC EXAMINATION OF THE LOWER LIMBS: All epicritic and tactile sensations grossly intact. Sharp and Dull discriminatory sensations at the plantar ball of hallux is intact bilateral.   MUSCULOSKELETAL EXAMINATION: Positive for tight Achilles tendon bilateral, weak first metatarsal bone, compensatory hyperpronation right ankle.  Radiographic examination reveal old surgery at metatarsal head with a fixation pain. High arched foot with plantar calcaneal spur right. Elevated first ray noted.  ASSESSMENT: Tight Achilles tendon bilateral. Sinus tarsitis from hyperpronation RL>. Unstable first ray bilateral leading to hyperpronation of STJ bilateral. Symptomatic compensatory hyperpronation right. Unresolved plantar fasciitis right.  PLAN: Reviewed  clinical findings and available treatment options. Proper shoe gear reviewed.  Both feet casted for custom orthotics. New ankle brace dispensed.

## 2017-05-17 ENCOUNTER — Encounter: Payer: Self-pay | Admitting: Podiatry

## 2017-07-12 ENCOUNTER — Encounter: Payer: Self-pay | Admitting: Podiatry

## 2017-07-12 ENCOUNTER — Ambulatory Visit (INDEPENDENT_AMBULATORY_CARE_PROVIDER_SITE_OTHER): Payer: 59 | Admitting: Podiatry

## 2017-07-12 DIAGNOSIS — M722 Plantar fascial fibromatosis: Secondary | ICD-10-CM

## 2017-07-12 DIAGNOSIS — M216X9 Other acquired deformities of unspecified foot: Secondary | ICD-10-CM

## 2017-07-12 NOTE — Progress Notes (Signed)
Stated that she feels a lot better. Orthotics are making her feet itch. Pain is much less. Ankle pain is much better and pretty much gone. Another question is if orthotics can be worn with other shoes other than sneakers. If itching continues, will replace top cover with different material.

## 2017-07-12 NOTE — Patient Instructions (Signed)
1 month follow up on orthotics. Doing well. Ankle pain has subsided. Having itching feeling at the bottom of forefoot. Will change the liner if feeling continues.

## 2018-06-16 ENCOUNTER — Other Ambulatory Visit: Payer: Self-pay

## 2018-06-16 ENCOUNTER — Emergency Department (HOSPITAL_COMMUNITY)
Admission: EM | Admit: 2018-06-16 | Discharge: 2018-06-16 | Disposition: A | Discharge status: Home / Self Care | Payer: 59 | Attending: Emergency Medicine | Admitting: Emergency Medicine

## 2018-06-16 ENCOUNTER — Emergency Department (HOSPITAL_COMMUNITY): Payer: 59

## 2018-06-16 DIAGNOSIS — R1031 Right lower quadrant pain: Secondary | ICD-10-CM

## 2018-06-16 DIAGNOSIS — Z79899 Other long term (current) drug therapy: Secondary | ICD-10-CM | POA: Insufficient documentation

## 2018-06-16 DIAGNOSIS — F17210 Nicotine dependence, cigarettes, uncomplicated: Secondary | ICD-10-CM | POA: Insufficient documentation

## 2018-06-16 DIAGNOSIS — K8020 Calculus of gallbladder without cholecystitis without obstruction: Secondary | ICD-10-CM

## 2018-06-16 DIAGNOSIS — R109 Unspecified abdominal pain: Secondary | ICD-10-CM | POA: Diagnosis present

## 2018-06-16 DIAGNOSIS — Z7982 Long term (current) use of aspirin: Secondary | ICD-10-CM | POA: Insufficient documentation

## 2018-06-16 LAB — BASIC METABOLIC PANEL
ANION GAP: 8 (ref 5–15)
BUN: 18 mg/dL (ref 6–20)
CALCIUM: 9.1 mg/dL (ref 8.9–10.3)
CO2: 26 mmol/L (ref 22–32)
Chloride: 104 mmol/L (ref 98–111)
Creatinine, Ser: 0.96 mg/dL (ref 0.44–1.00)
GFR calc Af Amer: >60 mL/min (ref >60)
GLUCOSE: 108 mg/dL — AB (ref 70–99)
Potassium: 3.4 mmol/L — ABNORMAL LOW (ref 3.5–5.1)
Sodium: 138 mmol/L (ref 135–145)

## 2018-06-16 LAB — URINALYSIS, ROUTINE W REFLEX MICROSCOPIC
Bilirubin Urine: NEGATIVE
GLUCOSE, UA: NEGATIVE mg/dL
Hgb urine dipstick: NEGATIVE
KETONES UR: NEGATIVE mg/dL
Leukocytes, UA: NEGATIVE
Nitrite: NEGATIVE
PROTEIN: NEGATIVE mg/dL
Specific Gravity, Urine: >1.046 — ABNORMAL HIGH (ref 1.005–1.030)
pH: 6 (ref 5.0–8.0)

## 2018-06-16 LAB — CBC
HCT: 45.5 % (ref 36.0–46.0)
Hemoglobin: 14.6 g/dL (ref 12.0–15.0)
MCH: 30 pg (ref 26.0–34.0)
MCHC: 32.1 g/dL (ref 30.0–36.0)
MCV: 93.4 fL (ref 80.0–100.0)
PLATELETS: 304 10*3/uL (ref 150–400)
RBC: 4.87 MIL/uL (ref 3.87–5.11)
RDW: 12.9 % (ref 11.5–15.5)
WBC: 7.4 10*3/uL (ref 4.0–10.5)
nRBC: 0 % (ref 0.0–0.2)

## 2018-06-16 MED ORDER — ONDANSETRON HCL 4 MG/2ML IJ SOLN
4.0000 mg | Freq: Once | INTRAMUSCULAR | Status: AC
  Administered 2018-06-16: 4 mg via INTRAVENOUS
  Filled 2018-06-16: qty 2

## 2018-06-16 MED ORDER — IOHEXOL 300 MG/ML  SOLN
100.0000 mL | Freq: Once | INTRAMUSCULAR | Status: AC | PRN
  Administered 2018-06-16: 100 mL via INTRAVENOUS

## 2018-06-16 MED ORDER — KETOROLAC TROMETHAMINE 30 MG/ML IJ SOLN
15.0000 mg | Freq: Once | INTRAMUSCULAR | Status: AC
  Administered 2018-06-16: 15 mg via INTRAVENOUS
  Filled 2018-06-16: qty 1

## 2018-06-16 MED ORDER — FENTANYL CITRATE (PF) 100 MCG/2ML IJ SOLN
50.0000 ug | Freq: Once | INTRAMUSCULAR | Status: AC
  Administered 2018-06-16: 50 ug via INTRAVENOUS
  Filled 2018-06-16: qty 2

## 2018-06-16 MED ORDER — SODIUM CHLORIDE 0.9 % IV BOLUS
500.0000 mL | Freq: Once | INTRAVENOUS | Status: AC
  Administered 2018-06-16: 500 mL via INTRAVENOUS

## 2018-06-16 NOTE — ED Notes (Signed)
Pt discharged from ED; instructions provided and scripts given; Pt encouraged to return to ED if symptoms worsen and to f/u with PCP; Pt verbalized understanding of all instructions 

## 2018-06-16 NOTE — ED Triage Notes (Signed)
Patient c/o right sided flank pain as well as right lower back and right lower abd that began about 4 hours ago. States she also feels nauseas.

## 2018-06-16 NOTE — Discharge Instructions (Addendum)
Followup with your doctor as needed

## 2018-06-16 NOTE — ED Provider Notes (Signed)
[facility] EMERGENCY DEPARTMENT Provider Note   CSN: [id number] Arrival date & time: 06/16/18  4098     History   Chief Complaint Chief Complaint  Patient presents with  . Flank Pain    right    HPI Tina Herring is a 57 y.o. female.  HPI Patient presents with right flank pain.  Goes to the back and abdomen.  Began around 4 hours ago.  Decreased appetite.  No dysuria.  No vaginal bleeding or discharge.  No diarrhea or constipation.  States she is on Augmentin for bronchitis.  Has had some coughing.  No rash.  Pain is worse with movement but does have a difficult time finding comfortable position.  Pain is sharp. Past Medical History:  Diagnosis Date  . GERD (gastroesophageal reflux disease)   . TIA (transient ischemic attack)     Patient Active Problem List   Diagnosis Date Noted  . Ankle pain, right 05/09/2017    Past Surgical History:  Procedure Laterality Date  . BUNIONECTOMY    . CERVICAL ABLATION    . CERVICAL FUSION    . CESAREAN SECTION    . DILATION AND CURETTAGE OF UTERUS    . JOINT REPLACEMENT Bilateral   . TONSILLECTOMY    . TOTAL KNEE ARTHROPLASTY Bilateral      OB History   None      Home Medications    Prior to Admission medications   Medication Sig Start Date End Date Taking? Authorizing Provider  amoxicillin-clavulanate (AUGMENTIN) 875-125 MG tablet Take 1 tablet by mouth 2 (two) times daily. 06/10/18 06/20/18 Yes [provider]  aspirin 81 MG tablet Take 81 mg by mouth daily.   Yes [provider]  chlorpheniramine-HYDROcodone (TUSSIONEX) 10-8 MG/5ML SUER Take 5 mLs by mouth every 12 (twelve) hours as needed. 06/10/18  Yes [provider]  dextromethorphan-guaiFENesin (MUCINEX DM) 30-600 MG 12hr tablet Take 1 tablet by mouth every 12 (twelve) hours. 06/10/18 06/20/18 Yes [provider]  escitalopram (LEXAPRO) 20 MG tablet Take 20 mg by mouth daily. 03/28/18  Yes [provider]  GARLIC PO Take 1,191 mg by mouth daily.    Yes [provider]  loratadine-pseudoephedrine (CLARITIN-D 24-HOUR) 10-240 MG 24 hr tablet Take 1 tablet by mouth daily.   Yes [provider]  Multiple Vitamin (MULTIVITAMIN WITH MINERALS) TABS tablet Take 1 tablet by mouth daily.   Yes [provider]  Omega-3 Fatty Acids (FISH OIL) 1000 MG CAPS Take 1,000 mg by mouth daily.   Yes [provider]  omeprazole (PRILOSEC) 20 MG capsule Take 20 mg by mouth daily.   Yes [provider]  VENTOLIN HFA 108 (90 Base) MCG/ACT inhaler Inhale 2 puffs into the lungs every 6 (six) hours as needed. 06/04/18  Yes [provider]  predniSONE (DELTASONE) 10 MG tablet Take 10 mg by mouth 2 (two) times daily. 06/10/18   [provider]    Family History No family history on file.  Social History Social History   Tobacco Use  . Smoking status: Current Every Day Smoker    Packs/day: 0.25    Types: Cigarettes  . Smokeless tobacco: Never Used  Substance Use Topics  . Alcohol use: No  . Drug use: No     Allergies   Shellfish allergy and Morphine and related   Review of Systems Review of Systems  Constitutional: Positive for appetite change.  HENT: Negative for congestion.   Respiratory: Negative for shortness  of breath.   Cardiovascular: Negative for chest pain.  Gastrointestinal: Positive for abdominal pain.  Genitourinary: Positive for flank pain. Negative for dysuria.  Musculoskeletal: Positive for back pain.  Skin: Negative for rash.  Neurological: Negative for weakness.  Psychiatric/Behavioral: Negative for confusion.     Physical Exam Updated Vital Signs BP (!) 115/54   Pulse 62   Temp 97.6 F (36.4 C) (Oral)   Resp 20   Ht 5\' 3"  (1.6 m)   Wt 95.3 kg   SpO2 98%   BMI 37.20 kg/m   Physical Exam  Constitutional: She appears well-developed.  Patient appears uncomfortable  HENT:  Head: Normocephalic.    Eyes: No scleral icterus.  Cardiovascular: Normal rate.  Pulmonary/Chest: She has no wheezes.  Abdominal: There is tenderness.  Right lower abdominal tenderness without rebound or guarding.  No hernia palpated  Musculoskeletal: She exhibits no edema.  Neurological: She is alert.  Skin: Skin is warm. Capillary refill takes less than 2 seconds.     ED Treatments / Results  Labs (all labs ordered are listed, but only abnormal results are displayed) Labs Reviewed  URINALYSIS, ROUTINE W REFLEX MICROSCOPIC - Abnormal; Notable for the following components:      Result Value   Specific Gravity, Urine >1.046 (*)    All other components within normal limits  BASIC METABOLIC PANEL - Abnormal; Notable for the following components:   Potassium 3.4 (*)    Glucose, Bld 108 (*)    All other components within normal limits  CBC    EKG None  Radiology Ct Abdomen Pelvis W Contrast  Result Date: 06/16/2018 CLINICAL DATA:  RIGHT-sided flank pain EXAM: CT ABDOMEN AND PELVIS WITH CONTRAST TECHNIQUE: Multidetector CT imaging of the abdomen and pelvis was performed using the standard protocol following bolus administration of intravenous contrast. CONTRAST:  OMNIPAQUE IOHEXOL 300 MG/ML  SOLN COMPARISON:  None. FINDINGS: Lower chest: Lung bases are clear. Hepatobiliary: No focal hepatic lesion. Single small gallstone. No gallbladder inflammation. Common bile duct normal. Pancreas: Pancreas is normal. No ductal dilatation. No pancreatic inflammation. Spleen: Normal spleen Adrenals/urinary tract: Adrenal glands and kidneys are normal. The ureters and bladder normal. Stomach/Bowel: Stomach, small bowel, appendix, and cecum are normal. Scattered diverticula of the colon without inflammation. Heaviest diverticulosis in the sigmoid colon. Rectum normal. Vascular/Lymphatic: Abdominal aorta is normal caliber. No periportal or retroperitoneal adenopathy. No pelvic adenopathy. Reproductive: Uterus and ovaries  normal. Other: No inguinal hernia. Musculoskeletal: No aggressive osseous lesion. IMPRESSION: 1. No acute abdominopelvic findings. 2. Normal appendix. 3. No obstructive uropathy. 4. Small gallstone without evidence cholecystitis. 5. Sigmoid diverticulosis without evidence diverticulitis. Electronically Signed   By: Genevive Bi M.D.   On: 06/16/2018 09:29    Procedures Procedures (including critical care time)  Medications Ordered in ED Medications  fentaNYL (SUBLIMAZE) injection 50 mcg (50 mcg Intravenous Given 06/16/18 0742)  sodium chloride 0.9 % bolus 500 mL (0 mLs Intravenous Stopped 06/16/18 0845)  ondansetron (ZOFRAN) injection 4 mg (4 mg Intravenous Given 06/16/18 0745)  iohexol (OMNIPAQUE) 300 MG/ML solution 100 mL (100 mLs Intravenous Contrast Given 06/16/18 0841)  ketorolac (TORADOL) 30 MG/ML injection 15 mg (15 mg Intravenous Given 06/16/18 1016)     Initial Impression / Assessment and Plan / ED Course  I have reviewed the triage vital signs and the nursing notes.  Pertinent labs & imaging results that were available during my care of the patient were reviewed by me and considered in my medical decision making (see chart  for details).     Patient presents with right-sided abdominal pain.  Lower abdomen.  Lab work reassuring.  CT scan done did not show cause.  Does have gallstones but unlikely the cause.  States she does not want surgical follow-up for this.  She is postmenopausal.  No vaginal complaints.  Feels better after treatment.  Discharged home to follow-up as an outpatient  Final Clinical Impressions(s) / ED Diagnoses   Final diagnoses:  Right lower quadrant abdominal pain  Calculus of gallbladder without cholecystitis without obstruction    ED Discharge Orders    None       Benjiman Core, MD 06/16/18 1119

## 2018-06-16 NOTE — ED Notes (Signed)
Patient transported to CT 

## 2019-08-16 ENCOUNTER — Encounter (HOSPITAL_BASED_OUTPATIENT_CLINIC_OR_DEPARTMENT_OTHER): Payer: Self-pay | Admitting: Emergency Medicine

## 2019-08-16 ENCOUNTER — Other Ambulatory Visit: Payer: Self-pay

## 2019-08-16 ENCOUNTER — Emergency Department (HOSPITAL_BASED_OUTPATIENT_CLINIC_OR_DEPARTMENT_OTHER): Payer: 59

## 2019-08-16 ENCOUNTER — Emergency Department (HOSPITAL_BASED_OUTPATIENT_CLINIC_OR_DEPARTMENT_OTHER)
Admission: EM | Admit: 2019-08-16 | Discharge: 2019-08-16 | Disposition: A | Discharge status: Home / Self Care | Payer: 59 | Attending: Emergency Medicine | Admitting: Emergency Medicine

## 2019-08-16 DIAGNOSIS — Z7982 Long term (current) use of aspirin: Secondary | ICD-10-CM | POA: Diagnosis not present

## 2019-08-16 DIAGNOSIS — R42 Dizziness and giddiness: Secondary | ICD-10-CM | POA: Diagnosis present

## 2019-08-16 DIAGNOSIS — R112 Nausea with vomiting, unspecified: Secondary | ICD-10-CM | POA: Insufficient documentation

## 2019-08-16 DIAGNOSIS — Z79899 Other long term (current) drug therapy: Secondary | ICD-10-CM | POA: Insufficient documentation

## 2019-08-16 DIAGNOSIS — F17210 Nicotine dependence, cigarettes, uncomplicated: Secondary | ICD-10-CM | POA: Diagnosis not present

## 2019-08-16 DIAGNOSIS — J449 Chronic obstructive pulmonary disease, unspecified: Secondary | ICD-10-CM | POA: Diagnosis not present

## 2019-08-16 DIAGNOSIS — Z96659 Presence of unspecified artificial knee joint: Secondary | ICD-10-CM | POA: Diagnosis not present

## 2019-08-16 HISTORY — DX: Chronic obstructive pulmonary disease, unspecified: J44.9

## 2019-08-16 HISTORY — DX: Dizziness and giddiness: R42

## 2019-08-16 LAB — CBC
HCT: 44.5 % (ref 36.0–46.0)
Hemoglobin: 14 g/dL (ref 12.0–15.0)
MCH: 30.4 pg (ref 26.0–34.0)
MCHC: 31.5 g/dL (ref 30.0–36.0)
MCV: 96.5 fL (ref 80.0–100.0)
Platelets: 238 10*3/uL (ref 150–400)
RBC: 4.61 MIL/uL (ref 3.87–5.11)
RDW: 14.3 % (ref 11.5–15.5)
WBC: 4.5 10*3/uL (ref 4.0–10.5)
nRBC: 0 % (ref 0.0–0.2)

## 2019-08-16 LAB — BASIC METABOLIC PANEL
Anion gap: 9 (ref 5–15)
BUN: 18 mg/dL (ref 6–20)
CO2: 26 mmol/L (ref 22–32)
Calcium: 9.8 mg/dL (ref 8.9–10.3)
Chloride: 104 mmol/L (ref 98–111)
Creatinine, Ser: 0.73 mg/dL (ref 0.44–1.00)
GFR calc Af Amer: >60 mL/min (ref >60)
GFR calc non Af Amer: >60 mL/min (ref >60)
Glucose, Bld: 106 mg/dL — ABNORMAL HIGH (ref 70–99)
Potassium: 4.8 mmol/L (ref 3.5–5.1)
Sodium: 139 mmol/L (ref 135–145)

## 2019-08-16 MED ORDER — ONDANSETRON 4 MG PO TBDP: 4.0000 mg | ORAL_TABLET | Freq: Three times a day (TID) | ORAL | 0 refills | Status: AC | PRN

## 2019-08-16 MED ORDER — MECLIZINE HCL 25 MG PO TABS: 25.0000 mg | ORAL_TABLET | Freq: Three times a day (TID) | ORAL | 0 refills | Status: AC | PRN

## 2019-08-16 MED ORDER — MECLIZINE HCL 25 MG PO TABS
25.0000 mg | ORAL_TABLET | Freq: Once | ORAL | Status: AC
  Administered 2019-08-16: 25 mg via ORAL
  Filled 2019-08-16: qty 1

## 2019-08-16 MED ORDER — ONDANSETRON 4 MG PO TBDP
4.0000 mg | ORAL_TABLET | Freq: Once | ORAL | Status: AC
  Administered 2019-08-16: 4 mg via ORAL
  Filled 2019-08-16: qty 1

## 2019-08-16 MED ORDER — GADOBUTROL 1 MMOL/ML IV SOLN
10.0000 mL | Freq: Once | INTRAVENOUS | Status: AC | PRN
  Administered 2019-08-16: 10 mL via INTRAVENOUS

## 2019-08-16 NOTE — Discharge Instructions (Signed)
Your testing shows no evidence of stroke.  Take the dizziness medication as prescribed.  Remember it could make you dizzy take it if you are working or driving.  Follow-up with your doctor.  Return to the ED if you develop new or worsening symptoms.

## 2019-08-16 NOTE — ED Notes (Signed)
Pt ambulated around the room without assistance.

## 2019-08-16 NOTE — ED Provider Notes (Signed)
[facility] EMERGENCY DEPARTMENT Provider Note   CSN: [id number] Arrival date & time: 08/16/19  1324     History Chief Complaint  Patient presents with  . Dizziness    Tina Herring is a 59 y.o. female.  Patient with history of COPD, TIA, vertigo here with persistent vertigo symptoms for the past 3 days.  States she extreme room spinning dizziness that is not positional.  States it is associated with nausea and vomiting as well as a "rushing sensation" in her right ear.  Has had issues with room spinning dizziness and vertigo since October.  She saw her PCP was told to take Dramamine.  She did have an MRI several years ago which showed a "TIA".  She states she is having trouble walking and trouble balancing today because the dizziness is severe and having severe nausea and vomiting.  There is no focal weakness in her arms or legs.  There is no difficulty speaking or difficulty swallowing.  No chest pain or shortness of breath.  Denies any visual change.  Feels that her dizziness is present at all times and is not worse when she changes position.  The history is provided by the patient.  Dizziness Associated symptoms: nausea and vomiting   Associated symptoms: no chest pain, no shortness of breath and no weakness        Past Medical History:  Diagnosis Date  . COPD (chronic obstructive pulmonary disease) (HCC)   . GERD (gastroesophageal reflux disease)   . TIA (transient ischemic attack)   . Vertigo     Patient Active Problem List   Diagnosis Date Noted  . Ankle pain, right 05/09/2017    Past Surgical History:  Procedure Laterality Date  . BUNIONECTOMY    . CERVICAL ABLATION    . CERVICAL FUSION    . CESAREAN SECTION    . DILATION AND CURETTAGE OF UTERUS    . JOINT REPLACEMENT Bilateral   . TONSILLECTOMY    . TOTAL KNEE ARTHROPLASTY Bilateral      OB History   No obstetric history on file.     No family history on file.  Social History    Tobacco Use  . Smoking status: Current Every Day Smoker    Packs/day: 0.25    Types: Cigarettes  . Smokeless tobacco: Never Used  Substance Use Topics  . Alcohol use: No  . Drug use: No    Home Medications Prior to Admission medications   Medication Sig Start Date End Date Taking? Authorizing Provider  budesonide-formoterol (SYMBICORT) 160-4.5 MCG/ACT inhaler Inhale 2 puffs into the lungs 2 (two) times daily.   Yes [provider]  aspirin 81 MG tablet Take 81 mg by mouth daily.    [provider]  chlorpheniramine-HYDROcodone (TUSSIONEX) 10-8 MG/5ML SUER Take 5 mLs by mouth every 12 (twelve) hours as needed. 06/10/18   [provider]  escitalopram (LEXAPRO) 20 MG tablet Take 20 mg by mouth daily. 03/28/18   [provider]  GARLIC PO Take 4,0981,000 mg by mouth daily.     [provider]  loratadine-pseudoephedrine (CLARITIN-D 24-HOUR) 10-240 MG 24 hr tablet Take 1 tablet by mouth daily.    [provider]  Multiple Vitamin (MULTIVITAMIN WITH MINERALS) TABS tablet Take 1 tablet by mouth daily.    [provider]  Omega-3 Fatty Acids (FISH OIL) 1000 MG CAPS Take 1,000 mg by mouth daily.    [provider]  omeprazole (PRILOSEC) 20 MG capsule Take  20 mg by mouth daily.    [provider]  predniSONE (DELTASONE) 10 MG tablet Take 10 mg by mouth 2 (two) times daily. 06/10/18   [provider]  VENTOLIN HFA 108 (90 Base) MCG/ACT inhaler Inhale 2 puffs into the lungs every 6 (six) hours as needed. 06/04/18   [provider]    Allergies    Shellfish allergy and Morphine and related  Review of Systems   Review of Systems  Constitutional: Negative for activity change, appetite change and fever.  HENT: Negative for congestion and rhinorrhea.   Eyes: Negative for visual disturbance.  Respiratory: Negative for chest tightness and shortness of breath.   Cardiovascular: Negative for chest pain.   Gastrointestinal: Positive for nausea and vomiting. Negative for abdominal distention and abdominal pain.  Genitourinary: Negative for dysuria, hematuria, vaginal bleeding and vaginal discharge.  Musculoskeletal: Negative for arthralgias and myalgias.  Skin: Negative for rash.  Neurological: Positive for dizziness and light-headedness. Negative for weakness.    all other systems are negative except as noted in the HPI and PMH.   Physical Exam Updated Vital Signs BP 131/67 (BP Location: Right Arm)   Pulse 81   Temp 98.2 F (36.8 C) (Oral)   Resp 18   Ht 5\' 3"  (1.6 m)   Wt 93 kg   SpO2 97%   BMI 36.31 kg/m   Physical Exam Vitals and nursing note reviewed.  Constitutional:      General: She is not in acute distress.    Appearance: She is well-developed. She is obese.  HENT:     Head: Normocephalic and atraumatic.     Right Ear: Tympanic membrane normal.     Left Ear: Tympanic membrane normal.     Mouth/Throat:     Pharynx: No oropharyngeal exudate.  Eyes:     Conjunctiva/sclera: Conjunctivae normal.     Pupils: Pupils are equal, round, and reactive to light.     Comments: Right-sided horizontal nystagmus  Neck:     Comments: No meningismus. Cardiovascular:     Rate and Rhythm: Normal rate and regular rhythm.     Heart sounds: Normal heart sounds. No murmur.  Pulmonary:     Effort: Pulmonary effort is normal. No respiratory distress.     Breath sounds: Normal breath sounds.  Abdominal:     Palpations: Abdomen is soft.     Tenderness: There is no abdominal tenderness. There is no guarding or rebound.  Musculoskeletal:        General: No tenderness. Normal range of motion.     Cervical back: Normal range of motion and neck supple.  Skin:    General: Skin is warm.  Neurological:     Mental Status: She is alert and oriented to person, place, and time.     Cranial Nerves: No cranial nerve deficit.     Motor: No abnormal muscle tone.     Coordination: Coordination  normal.     Comments: CN 2-12 intact, no ataxia on finger to nose, 5/5 strength throughout, no pronator drift,  Right-sided horizontal nystagmus, positive Romberg, ataxic gait   Psychiatric:        Behavior: Behavior normal.     ED Results / Procedures / Treatments   Labs (all labs ordered are listed, but only abnormal results are displayed) Labs Reviewed  BASIC METABOLIC PANEL - Abnormal; Notable for the following components:      Result Value   Glucose, Bld 106 (*)    All other  components within normal limits  CBC    EKG EKG Interpretation  Date/Time:  Saturday August 16 2019 13:32:41 EST Ventricular Rate:  80 PR Interval:  154 QRS Duration: 76 QT Interval:  366 QTC Calculation: 422 R Axis:   71 Text Interpretation: Normal sinus rhythm with sinus arrhythmia Normal ECG No previous ECGs available Confirmed by Glynn Octave [phone]) on 08/16/2019 3:59:32 PM   Radiology MR ANGIO HEAD WO CONTRAST  Result Date: 08/16/2019 CLINICAL DATA:  Central vertigo over the last 2 days. EXAM: MRI HEAD WITHOUT AND WITH CONTRAST MRA HEAD WITHOUT CONTRAST MRA NECK WITHOUT AND WITH CONTRAST TECHNIQUE: Multiplanar, multiecho pulse sequences of the brain and surrounding structures were obtained without and with intravenous contrast. Angiographic images of the Circle of Willis were obtained using MRA technique without intravenous contrast. Angiographic images of the neck were obtained using MRA technique without and with intravenous contrast. Carotid stenosis measurements (when applicable) are obtained utilizing NASCET criteria, using the distal internal carotid diameter as the denominator. CONTRAST:  17mL GADAVIST GADOBUTROL 1 MMOL/ML IV SOLN COMPARISON:  None. FINDINGS: MRI HEAD FINDINGS Brain: Diffusion imaging does not show any acute or subacute infarction. The brainstem and cerebellum are normal. Cerebral hemispheres are normal. No evidence of atrophy, small or large vessel infarction, mass lesion,  hemorrhage, hydrocephalus or extra-axial collection. Vascular: Major vessels at the base of the brain show flow. Skull and upper cervical spine: Negative Sinuses/Orbits: Clear/normal Other: Mastoid effusion on the left. This is not extensive, though it could possibly relate to vertigo. MRA HEAD FINDINGS Both internal carotid arteries are widely patent into the brain. No siphon stenosis. The anterior and middle cerebral vessels are patent without proximal stenosis, aneurysm or vascular malformation. Both vertebral arteries are widely patent to the basilar. No basilar stenosis. Posterior circulation branch vessels appear normal. MRA NECK FINDINGS Branching pattern is normal without origin stenosis. Both common carotid arteries are widely patent to their respective bifurcation. Both carotid bifurcations are normal. Both cervical internal carotid arteries are normal. Both vertebral arteries are widely patent and normal. IMPRESSION: Normal MRI of the brain. Normal MR angiography of the neck vessels. Normal intracranial MR angiography. Electronically Signed   By: Paulina Fusi M.D.   On: 08/16/2019 20:18   MR Angiogram Neck W or Wo Contrast  Result Date: 08/16/2019 CLINICAL DATA:  Central vertigo over the last 2 days. EXAM: MRI HEAD WITHOUT AND WITH CONTRAST MRA HEAD WITHOUT CONTRAST MRA NECK WITHOUT AND WITH CONTRAST TECHNIQUE: Multiplanar, multiecho pulse sequences of the brain and surrounding structures were obtained without and with intravenous contrast. Angiographic images of the Circle of Willis were obtained using MRA technique without intravenous contrast. Angiographic images of the neck were obtained using MRA technique without and with intravenous contrast. Carotid stenosis measurements (when applicable) are obtained utilizing NASCET criteria, using the distal internal carotid diameter as the denominator. CONTRAST:  7mL GADAVIST GADOBUTROL 1 MMOL/ML IV SOLN COMPARISON:  None. FINDINGS: MRI HEAD FINDINGS  Brain: Diffusion imaging does not show any acute or subacute infarction. The brainstem and cerebellum are normal. Cerebral hemispheres are normal. No evidence of atrophy, small or large vessel infarction, mass lesion, hemorrhage, hydrocephalus or extra-axial collection. Vascular: Major vessels at the base of the brain show flow. Skull and upper cervical spine: Negative Sinuses/Orbits: Clear/normal Other: Mastoid effusion on the left. This is not extensive, though it could possibly relate to vertigo. MRA HEAD FINDINGS Both internal carotid arteries are widely patent into the brain. No siphon stenosis. The anterior and  middle cerebral vessels are patent without proximal stenosis, aneurysm or vascular malformation. Both vertebral arteries are widely patent to the basilar. No basilar stenosis. Posterior circulation branch vessels appear normal. MRA NECK FINDINGS Branching pattern is normal without origin stenosis. Both common carotid arteries are widely patent to their respective bifurcation. Both carotid bifurcations are normal. Both cervical internal carotid arteries are normal. Both vertebral arteries are widely patent and normal. IMPRESSION: Normal MRI of the brain. Normal MR angiography of the neck vessels. Normal intracranial MR angiography. Electronically Signed   By: Nelson Chimes M.D.   On: 08/16/2019 20:18   MR BRAIN WO CONTRAST  Result Date: 08/16/2019 CLINICAL DATA:  Central vertigo over the last 2 days. EXAM: MRI HEAD WITHOUT AND WITH CONTRAST MRA HEAD WITHOUT CONTRAST MRA NECK WITHOUT AND WITH CONTRAST TECHNIQUE: Multiplanar, multiecho pulse sequences of the brain and surrounding structures were obtained without and with intravenous contrast. Angiographic images of the Circle of Willis were obtained using MRA technique without intravenous contrast. Angiographic images of the neck were obtained using MRA technique without and with intravenous contrast. Carotid stenosis measurements (when applicable) are  obtained utilizing NASCET criteria, using the distal internal carotid diameter as the denominator. CONTRAST:  38mL GADAVIST GADOBUTROL 1 MMOL/ML IV SOLN COMPARISON:  None. FINDINGS: MRI HEAD FINDINGS Brain: Diffusion imaging does not show any acute or subacute infarction. The brainstem and cerebellum are normal. Cerebral hemispheres are normal. No evidence of atrophy, small or large vessel infarction, mass lesion, hemorrhage, hydrocephalus or extra-axial collection. Vascular: Major vessels at the base of the brain show flow. Skull and upper cervical spine: Negative Sinuses/Orbits: Clear/normal Other: Mastoid effusion on the left. This is not extensive, though it could possibly relate to vertigo. MRA HEAD FINDINGS Both internal carotid arteries are widely patent into the brain. No siphon stenosis. The anterior and middle cerebral vessels are patent without proximal stenosis, aneurysm or vascular malformation. Both vertebral arteries are widely patent to the basilar. No basilar stenosis. Posterior circulation branch vessels appear normal. MRA NECK FINDINGS Branching pattern is normal without origin stenosis. Both common carotid arteries are widely patent to their respective bifurcation. Both carotid bifurcations are normal. Both cervical internal carotid arteries are normal. Both vertebral arteries are widely patent and normal. IMPRESSION: Normal MRI of the brain. Normal MR angiography of the neck vessels. Normal intracranial MR angiography. Electronically Signed   By: Nelson Chimes M.D.   On: 08/16/2019 20:18    Procedures Procedures (including critical care time)  Medications Ordered in ED Medications  meclizine (ANTIVERT) tablet 25 mg (has no administration in time range)  ondansetron (ZOFRAN-ODT) disintegrating tablet 4 mg (has no administration in time range)    ED Course  I have reviewed the triage vital signs and the nursing notes.  Pertinent labs & imaging results that were available during my  care of the patient were reviewed by me and considered in my medical decision making (see chart for details).    MDM Rules/Calculators/A&P                      3 days of vertigo with nausea and vomiting.  She has nystagmus on exam with positive Romberg and ataxic gait.  Suspect peripheral vertigo though patient does have a history of TIA and is very symptomatic.  We will proceed with imaging.  MRI is negative.  MRA shows no evidence of vertebrobasilar insufficiency.  Patient feels improved and is tolerating p.o. and ambulatory.  We will treat supportively  for suspected peripheral vertigo.  Will treat with antiemetics and meclizine.  Follow-up with PCP.  Return precautions discussed. Final Clinical Impression(s) / ED Diagnoses Final diagnoses:  Vertigo    Rx / DC Orders ED Discharge Orders    None       Kyriana Yankee, Jeannett Senior, MD 08/16/19 2342

## 2019-08-16 NOTE — ED Triage Notes (Signed)
Dizziness, nausea, tinnitus x 2 days. Hx of vertigo.

## 2019-08-16 NOTE — ED Notes (Signed)
Pt was given a Diet Ginger Ale

## 2021-12-23 ENCOUNTER — Emergency Department (HOSPITAL_BASED_OUTPATIENT_CLINIC_OR_DEPARTMENT_OTHER)
Admission: EM | Admit: 2021-12-23 | Discharge: 2021-12-23 | Disposition: A | Discharge status: Home / Self Care | Payer: 59 | Attending: Emergency Medicine | Admitting: Emergency Medicine

## 2021-12-23 ENCOUNTER — Encounter (HOSPITAL_BASED_OUTPATIENT_CLINIC_OR_DEPARTMENT_OTHER): Payer: Self-pay

## 2021-12-23 ENCOUNTER — Other Ambulatory Visit: Payer: Self-pay

## 2021-12-23 ENCOUNTER — Other Ambulatory Visit (HOSPITAL_BASED_OUTPATIENT_CLINIC_OR_DEPARTMENT_OTHER): Payer: Self-pay

## 2021-12-23 ENCOUNTER — Emergency Department (HOSPITAL_BASED_OUTPATIENT_CLINIC_OR_DEPARTMENT_OTHER): Payer: 59

## 2021-12-23 DIAGNOSIS — Z7982 Long term (current) use of aspirin: Secondary | ICD-10-CM | POA: Insufficient documentation

## 2021-12-23 DIAGNOSIS — M545 Low back pain, unspecified: Secondary | ICD-10-CM | POA: Insufficient documentation

## 2021-12-23 DIAGNOSIS — M549 Dorsalgia, unspecified: Secondary | ICD-10-CM

## 2021-12-23 LAB — CBC WITH DIFFERENTIAL/PLATELET
Abs Immature Granulocytes: 0.02 K/uL (ref 0.00–0.07)
Basophils Absolute: 0 K/uL (ref 0.0–0.1)
Basophils Relative: 0 %
Eosinophils Absolute: 0 K/uL (ref 0.0–0.5)
Eosinophils Relative: 1 %
HCT: 37.4 % (ref 36.0–46.0)
Hemoglobin: 12.5 g/dL (ref 12.0–15.0)
Immature Granulocytes: 0 %
Lymphocytes Relative: 17 %
Lymphs Abs: 1.2 K/uL (ref 0.7–4.0)
MCH: 30.4 pg (ref 26.0–34.0)
MCHC: 33.4 g/dL (ref 30.0–36.0)
MCV: 91 fL (ref 80.0–100.0)
Monocytes Absolute: 0.8 K/uL (ref 0.1–1.0)
Monocytes Relative: 11 %
Neutro Abs: 4.8 K/uL (ref 1.7–7.7)
Neutrophils Relative %: 71 %
Platelets: 219 K/uL (ref 150–400)
RBC: 4.11 MIL/uL (ref 3.87–5.11)
RDW: 13.7 % (ref 11.5–15.5)
WBC: 6.8 K/uL (ref 4.0–10.5)
nRBC: 0 % (ref 0.0–0.2)

## 2021-12-23 LAB — COMPREHENSIVE METABOLIC PANEL
ALT: 19 U/L (ref 0–44)
AST: 19 U/L (ref 15–41)
Albumin: 3.8 g/dL (ref 3.5–5.0)
Alkaline Phosphatase: 84 U/L (ref 38–126)
Anion gap: 9 (ref 5–15)
BUN: 16 mg/dL (ref 8–23)
CO2: 24 mmol/L (ref 22–32)
Calcium: 9.1 mg/dL (ref 8.9–10.3)
Chloride: 104 mmol/L (ref 98–111)
Creatinine, Ser: 0.78 mg/dL (ref 0.44–1.00)
GFR, Estimated: >60 mL/min (ref >60)
Glucose, Bld: 85 mg/dL (ref 70–99)
Potassium: 3.3 mmol/L — ABNORMAL LOW (ref 3.5–5.1)
Sodium: 137 mmol/L (ref 135–145)
Total Bilirubin: 0.6 mg/dL (ref 0.3–1.2)
Total Protein: 7 g/dL (ref 6.5–8.1)

## 2021-12-23 LAB — URINALYSIS, ROUTINE W REFLEX MICROSCOPIC
Bilirubin Urine: NEGATIVE
Glucose, UA: NEGATIVE mg/dL
Hgb urine dipstick: NEGATIVE
Ketones, ur: NEGATIVE mg/dL
Leukocytes,Ua: NEGATIVE
Nitrite: NEGATIVE
Protein, ur: NEGATIVE mg/dL
Specific Gravity, Urine: 1.01 (ref 1.005–1.030)
pH: 7 (ref 5.0–8.0)

## 2021-12-23 MED ORDER — FENTANYL CITRATE PF 50 MCG/ML IJ SOSY
50.0000 ug | PREFILLED_SYRINGE | Freq: Once | INTRAMUSCULAR | Status: AC
  Administered 2021-12-23: 50 ug via INTRAVENOUS
  Filled 2021-12-23: qty 1

## 2021-12-23 MED ORDER — OXYCODONE HCL 5 MG PO TABS
5.0000 mg | ORAL_TABLET | ORAL | 0 refills | Status: AC | PRN
  Filled 2021-12-23: qty 10, 2d supply, fill #0

## 2021-12-23 MED ORDER — CYCLOBENZAPRINE HCL 10 MG PO TABS
10.0000 mg | ORAL_TABLET | Freq: Once | ORAL | Status: AC
  Administered 2021-12-23: 10 mg via ORAL
  Filled 2021-12-23: qty 1

## 2021-12-23 MED ORDER — PREDNISONE 10 MG PO TABS
40.0000 mg | ORAL_TABLET | Freq: Every day | ORAL | 0 refills | Status: AC
  Filled 2021-12-23: qty 20, 5d supply, fill #0

## 2021-12-23 MED ORDER — IOHEXOL 350 MG/ML SOLN
100.0000 mL | Freq: Once | INTRAVENOUS | Status: AC | PRN
  Administered 2021-12-23: 100 mL via INTRAVENOUS

## 2021-12-23 MED ORDER — KETOROLAC TROMETHAMINE 30 MG/ML IJ SOLN
30.0000 mg | Freq: Once | INTRAMUSCULAR | Status: AC
  Administered 2021-12-23: 30 mg via INTRAVENOUS
  Filled 2021-12-23: qty 1

## 2021-12-23 MED ORDER — CYCLOBENZAPRINE HCL 10 MG PO TABS
10.0000 mg | ORAL_TABLET | Freq: Two times a day (BID) | ORAL | 0 refills | Status: AC | PRN
  Filled 2021-12-23: qty 20, 10d supply, fill #0

## 2021-12-23 MED ORDER — LIDOCAINE 4 % EX PTCH
1.0000 | MEDICATED_PATCH | CUTANEOUS | 0 refills | Status: AC
  Filled 2021-12-23 (×2): qty 10, 10d supply, fill #0

## 2021-12-23 NOTE — ED Triage Notes (Signed)
C/o bilateral lumbar back pain. Denies other symptoms. States had a scan a few months ago that showed kidney stones. ?

## 2021-12-23 NOTE — ED Notes (Signed)
Pt. Reports pain is back now to a 9/10 ?

## 2021-12-23 NOTE — Discharge Instructions (Signed)
You may take acetaminophen or tylenol 1000 mg 4 times a day for 1 week. This is the maximum dose of Tylenol (acetaminophen) you can take from all sources. Please check other over-the-counter medications and prescriptions to ensure you are not taking other medications that contain acetaminophen.  You may also take ibuprofen 400 mg 6 times a day alternating with or at the same time as tylenol, or 600mg  4 times a day.  Take oxycodone as needed for breakthrough pain.  This medication can be addicting, sedating and cause constipation.  You may take lidocaine patch and flexeril as well. Flexeril will also make you sleepy.  ?

## 2021-12-23 NOTE — ED Provider Notes (Signed)
?[facility] EMERGENCY DEPARTMENT ?Provider Note ? ? ?CSN: [id number] ?Arrival date & time: 12/23/21  1302 ? ?  ? ?History ?Chief Complaint  ?Patient presents with  ? Back Pain  ? ? ?Tina Herring is a 61 y.o. female. ? ?Patient presents with 3 day history of back pain that she describes as a sharp, stabbing pain she has never had before. Rates the pain 9/10 which has been the worst. She went to her PCP recently and they did a CT where she said there were kidney stones. Pain starts at her lower back, left more than the right, and sometimes radiates to the anterior thigh. Denies fever, chills, dysuria, nausea, vomiting, chest pain, dysuria, abdominal pain, vaginal discharge and other pain. Ibuprofen has improved the pain to 3/10. Husband also at bedside.  ? ?The history is provided by the patient. No language interpreter was used.  ?Back Pain ?Location:  Lumbar spine ?Quality:  Stabbing ?Context: not recent illness and not recent injury   ?Relieved by:  Ibuprofen ?Associated symptoms: no abdominal pain, no bladder incontinence, no bowel incontinence, no chest pain, no dysuria, no fever, no headaches, no numbness, no paresthesias, no pelvic pain, no perianal numbness, no tingling and no weakness   ?Risk factors: no hx of cancer and no recent surgery   ? ?  ? ?Home Medications ?Prior to Admission medications   ?Medication Sig Start Date End Date Taking? Authorizing Provider  ?aspirin 81 MG tablet Take 81 mg by mouth daily.    [provider]  ?budesonide-formoterol (SYMBICORT) 160-4.5 MCG/ACT inhaler Inhale 2 puffs into the lungs 2 (two) times daily.    [provider]  ?chlorpheniramine-HYDROcodone (TUSSIONEX) 10-8 MG/5ML SUER Take 5 mLs by mouth every 12 (twelve) hours as needed. 06/10/18   [provider]  ?escitalopram (LEXAPRO) 20 MG tablet Take 20 mg by mouth daily. 03/28/18   [provider]  ?GARLIC PO Take 1,4781,000 mg by mouth daily.     [provider]   ?loratadine-pseudoephedrine (CLARITIN-D 24-HOUR) 10-240 MG 24 hr tablet Take 1 tablet by mouth daily.    [provider]  ?meclizine (ANTIVERT) 25 MG tablet Take 1 tablet (25 mg total) by mouth 3 (three) times daily as needed for dizziness. 08/16/19   Rancour, Jeannett SeniorStephen, MD  ?Multiple Vitamin (MULTIVITAMIN WITH MINERALS) TABS tablet Take 1 tablet by mouth daily.    [provider]  ?Omega-3 Fatty Acids (FISH OIL) 1000 MG CAPS Take 1,000 mg by mouth daily.    [provider]  ?omeprazole (PRILOSEC) 20 MG capsule Take 20 mg by mouth daily.    [provider]  ?ondansetron (ZOFRAN ODT) 4 MG disintegrating tablet Take 1 tablet (4 mg total) by mouth every 8 (eight) hours as needed for nausea or vomiting. 08/16/19   Rancour, Jeannett SeniorStephen, MD  ?predniSONE (DELTASONE) 10 MG tablet Take 10 mg by mouth 2 (two) times daily. 06/10/18   [provider]  ?VENTOLIN HFA 108 (90 Base) MCG/ACT inhaler Inhale 2 puffs into the lungs every 6 (six) hours as needed. 06/04/18   [provider]  ?   ? ?Allergies    ?Shellfish allergy and Morphine and related   ? ?Review of Systems   ?Review of Systems  ?Constitutional:  Negative for activity change, appetite change, chills and fever.  ?HENT:  Negative for congestion.   ?Eyes:  Negative for visual disturbance.  ?Respiratory:  Negative for cough and shortness of breath.   ?Cardiovascular:  Negative for chest  pain and leg swelling.  ?Gastrointestinal:  Negative for abdominal pain, blood in stool, bowel incontinence, nausea and vomiting.  ?Genitourinary:  Negative for bladder incontinence, decreased urine volume, dysuria, hematuria, pelvic pain and vaginal discharge.  ?Musculoskeletal:  Positive for back pain.  ?Neurological:  Negative for dizziness, tingling, weakness, numbness, headaches and paresthesias.  ? ?Physical Exam ?Updated Vital Signs ?BP (!) 147/81 (BP Location: Right Arm)   Pulse 93   Temp 98.4 ?F (36.9 ?C) (Oral)   Resp 20   Ht 5\' 2"   (1.575 m)   Wt 95.7 kg   SpO2 96%   BMI 38.59 kg/m?  ?Physical Exam ?Vitals reviewed.  ?Constitutional:   ?   General: She is not in acute distress. ?   Appearance: Normal appearance. She is normal weight. She is not toxic-appearing.  ?HENT:  ?   Head: Normocephalic and atraumatic.  ?   Right Ear: External ear normal.  ?   Left Ear: External ear normal.  ?   Nose: Nose normal. No congestion or rhinorrhea.  ?   Mouth/Throat:  ?   Mouth: Mucous membranes are moist.  ?   Pharynx: Oropharynx is clear. No oropharyngeal exudate or posterior oropharyngeal erythema.  ?Eyes:  ?   General: No scleral icterus.    ?   Right eye: No discharge.     ?   Left eye: No discharge.  ?   Conjunctiva/sclera: Conjunctivae normal.  ?   Pupils: Pupils are equal, round, and reactive to light.  ?Cardiovascular:  ?   Rate and Rhythm: Normal rate and regular rhythm.  ?   Pulses: Normal pulses.  ?   Heart sounds: Normal heart sounds. No murmur heard. ?  No gallop.  ?Pulmonary:  ?   Effort: Pulmonary effort is normal. No respiratory distress.  ?   Breath sounds: Normal breath sounds. No wheezing or rales.  ?Abdominal:  ?   General: Bowel sounds are normal. There is no distension.  ?   Palpations: Abdomen is soft. There is no mass.  ?   Tenderness: There is no abdominal tenderness. There is no right CVA tenderness, left CVA tenderness or guarding.  ?Musculoskeletal:     ?   General: Tenderness (tenderness along midline L4-L5 without associated erythema or rash) present.  ?   Cervical back: Neck supple. No tenderness.  ?   Right lower leg: No edema.  ?   Left lower leg: No edema.  ?Lymphadenopathy:  ?   Cervical: No cervical adenopathy.  ?Skin: ?   General: Skin is warm.  ?   Capillary Refill: Capillary refill takes less than 2 seconds.  ?   Findings: No erythema or rash.  ?Neurological:  ?   General: No focal deficit present.  ?   Mental Status: She is alert and oriented to person, place, and time. Mental status is at baseline.  ?Psychiatric:      ?   Mood and Affect: Mood normal.     ?   Behavior: Behavior normal.  ? ? ?ED Results / Procedures / Treatments   ?Labs ?(all labs ordered are listed, but only abnormal results are displayed) ?Labs Reviewed  ?URINALYSIS, ROUTINE W REFLEX MICROSCOPIC  ?CBC WITH DIFFERENTIAL/PLATELET  ?COMPREHENSIVE METABOLIC PANEL  ? ? ?EKG ?None ? ?Radiology ?No results found. ? ?Procedures ?Procedures  ?None ? ?Medications Ordered in ED ?Medications  ?fentaNYL (SUBLIMAZE) injection 50 mcg (has no administration in time range)  ? ? ?ED Course/ Medical Decision Making/ A&P ?  ?                        ?  Medical Decision Making ?Amount and/or Complexity of Data Reviewed ?Labs: ordered. ?Radiology: ordered. ? ?Risk ?Prescription drug management. ? ? ? ?Patient presents with new onset back pain that she has never before. Reassuringly no systemic symptoms. Nephrolithiasis on the differential given history and clinical findings. Also concern for possible aortic dissection based on patient's high degree of pain severity. Obtained CT angio to rule out dissection and view possible renal stone. UA unremarkable. CMP pending and CBC unremarkable. Also MSK etiology on the differential although due to severe pain, will want to eliminate other causes. Imaging pending at time of shift change, Dr. Dalene Seltzer to discuss with oncoming provider for further management. ? ? ?Final Clinical Impression(s) / ED Diagnoses ?Final diagnoses:  ?Other acute back pain  ? ? ?Rx / DC Orders ?ED Discharge Orders   ? ? None  ? ?  ? ? ?  ?Reece Leader, DO ?12/23/21 1425 ? ?  ?Alvira Monday, MD ?12/23/21 2258 ? ?

## 2021-12-23 NOTE — ED Notes (Signed)
Pt discharged to home. Discharge instructions have been discussed with patient and/or family members. Pt verbally acknowledges understanding d/c instructions, and endorses comprehension to checkout at registration before leaving.  °

## 2022-07-29 IMAGING — CT CT ANGIO CHEST-ABD-PELV FOR DISSECTION W/ AND WO/W CM
2 of 9 series · 14 of 46 positions shown, 16 images · non-contrast
Comparison: CT June 16, 2018.

CLINICAL DATA: Bilateral lower back pain. Concern for aortic
dissection

EXAM:
CT ANGIOGRAPHY CHEST, ABDOMEN AND PELVIS
TECHNIQUE: Non-contrast CT of the chest was initially obtained.

[Series 5: axial arterial · axial · arterial · 0.90mm/px · z∈[+869,+1439]mm · 11 of 214 slices shown, 13 images]
[im 12/214  soft-tissue]
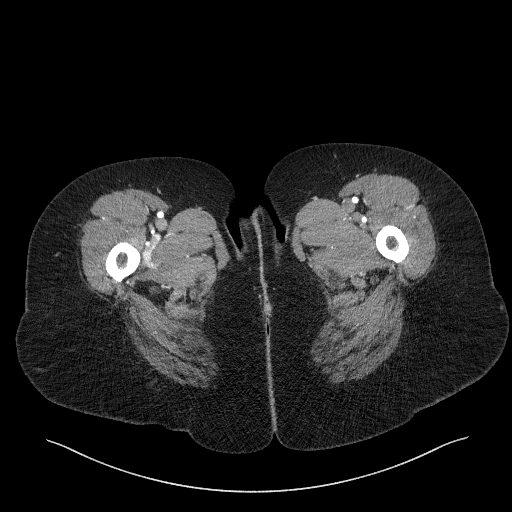
[im 12/214  bone]
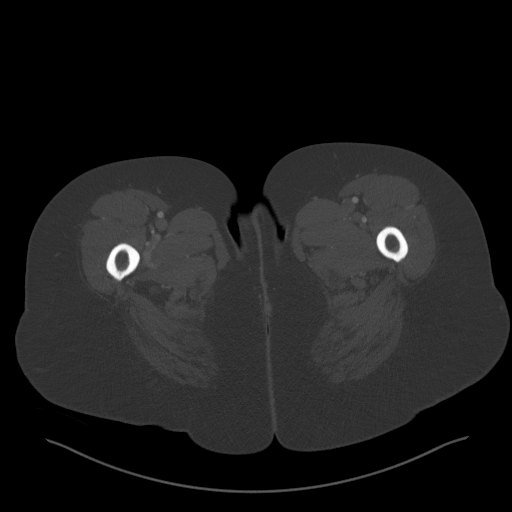
[im 34/214  soft-tissue]
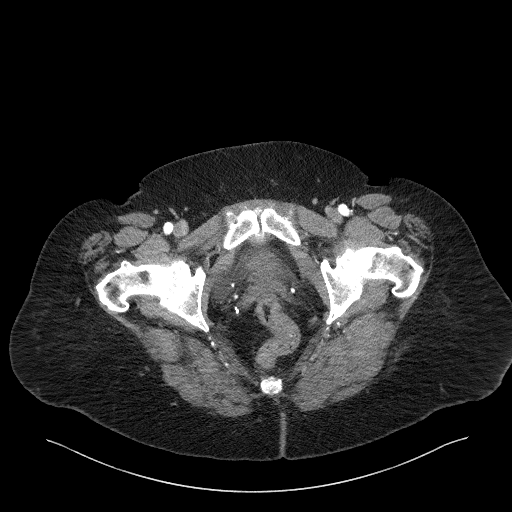
[im 57/214  soft-tissue]
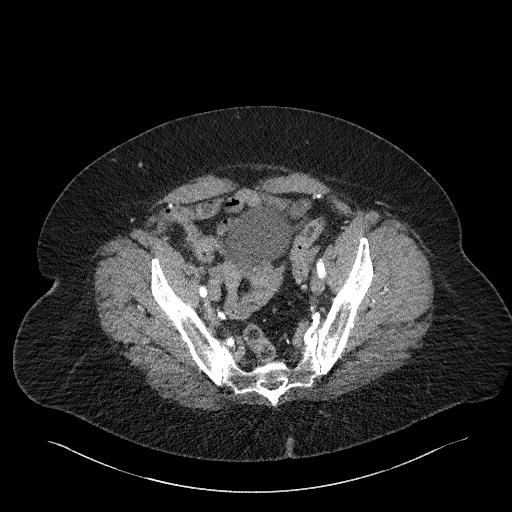
[im 68/214  soft-tissue]
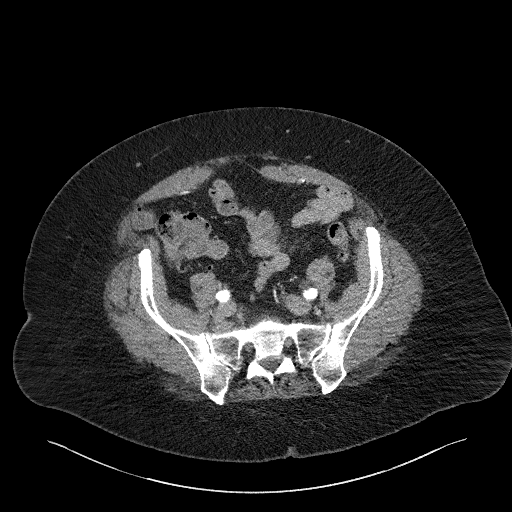
[im 90/214  soft-tissue]
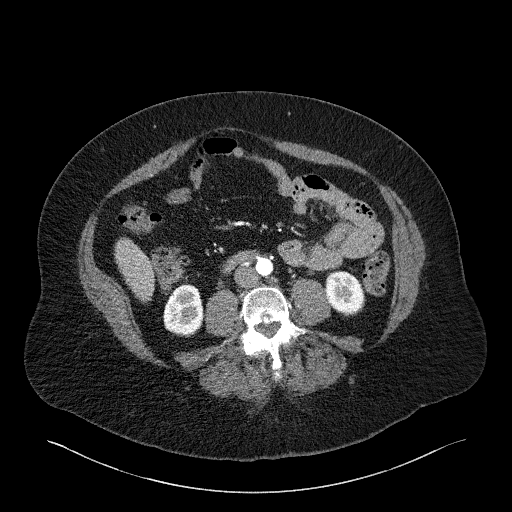
[im 113/214  soft-tissue]
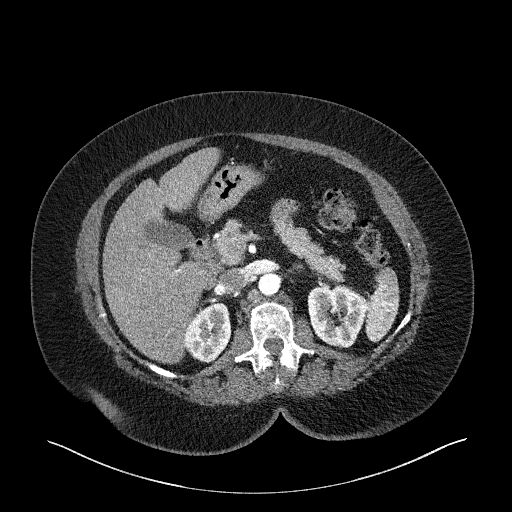
[im 124/214  soft-tissue]
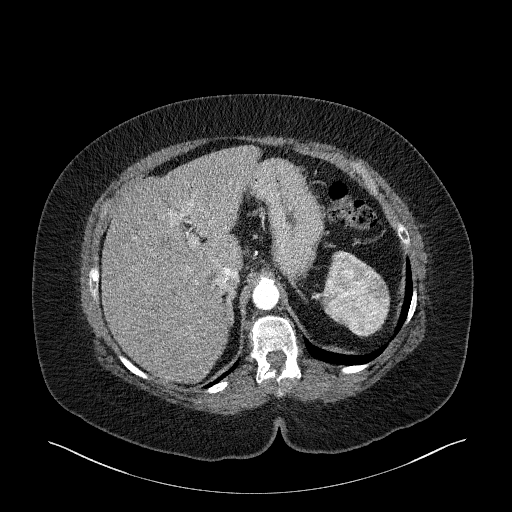
[im 146/214  soft-tissue]
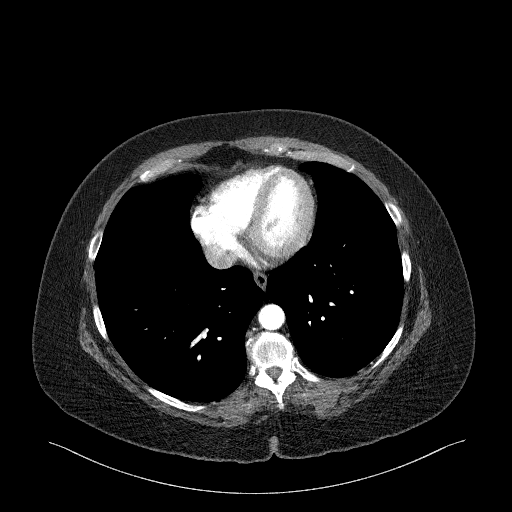
[im 157/214  soft-tissue]
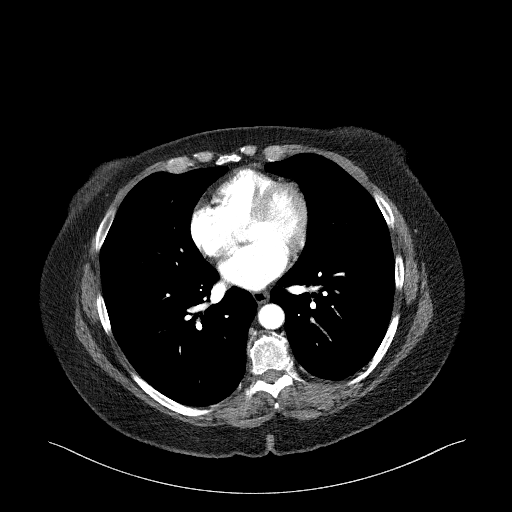
[im 157/214  bone]
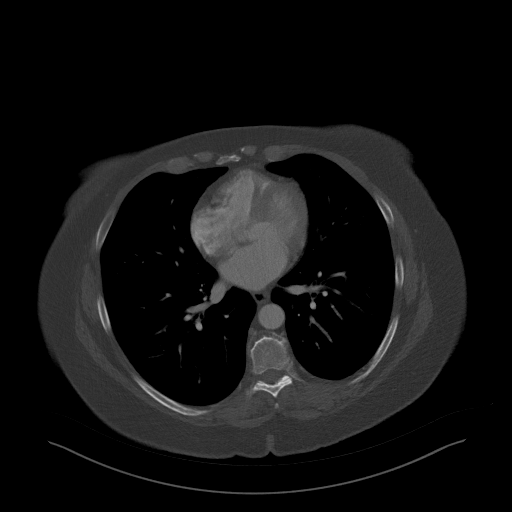
[im 180/214  soft-tissue]
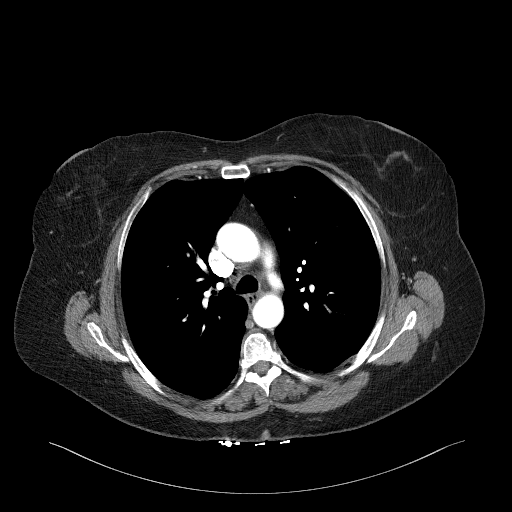
[im 202/214  soft-tissue]
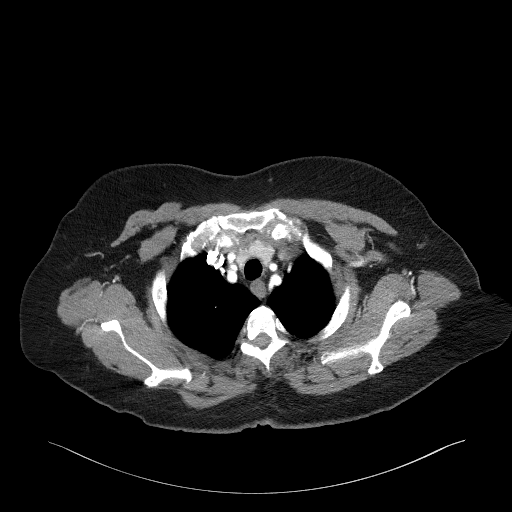

[Series 7: coronals · coronal · 0.91mm/px · 3 of 163 slices shown]
[im 41/163  soft-tissue]
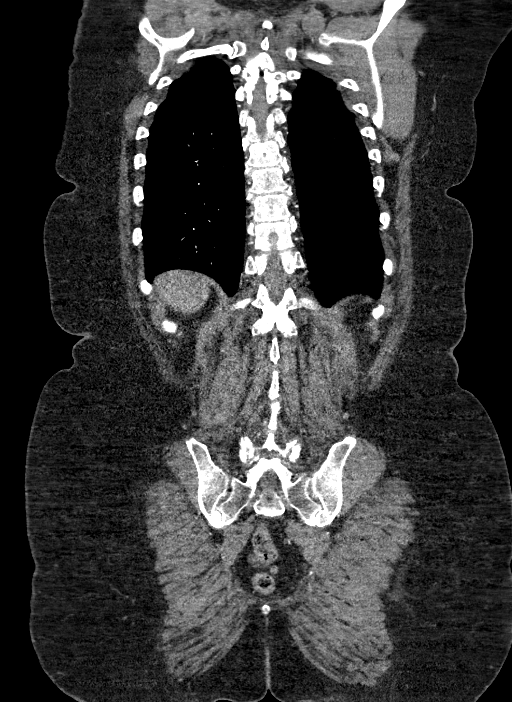
[im 82/163  soft-tissue]
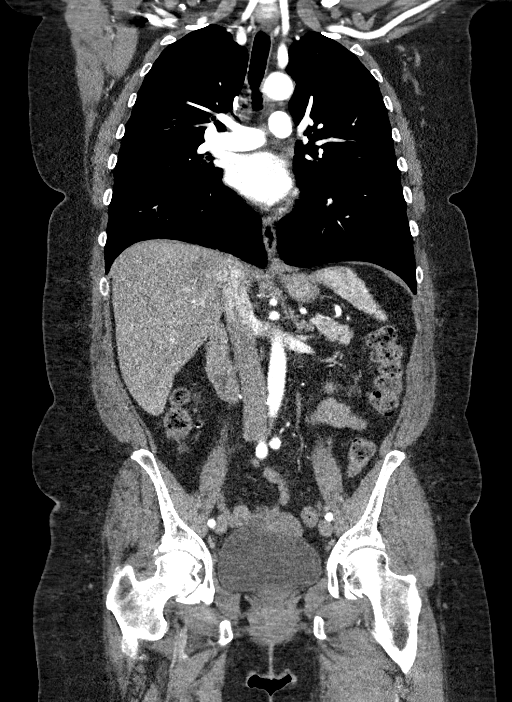
[im 122/163  soft-tissue]
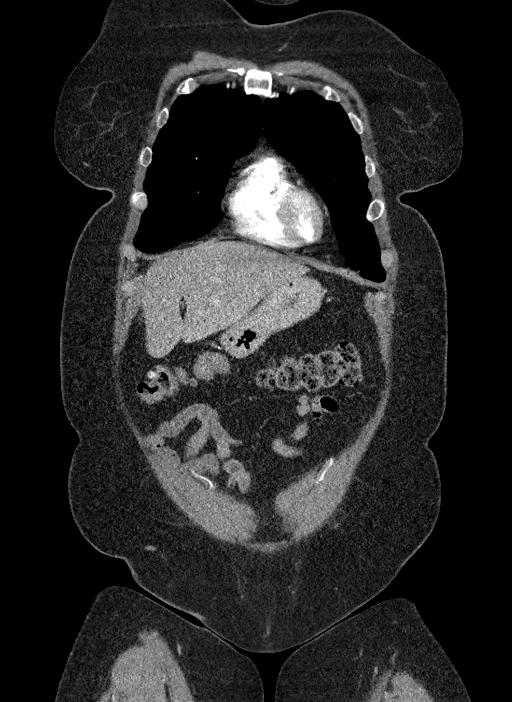

[14 of 46 positions shown; findings below may reference images not displayed]

Multidetector CT imaging through the chest, abdomen and pelvis was
performed using the standard protocol during bolus administration of
intravenous contrast. Multiplanar reconstructed images and MIPs were
obtained and reviewed to evaluate the vascular anatomy.

RADIATION DOSE REDUCTION: This exam was performed according to the
departmental dose-optimization program which includes automated
exposure control, adjustment of the mA and/or kV according to
patient size and/or use of iterative reconstruction technique.

CONTRAST:  100mL OMNIPAQUE IOHEXOL 350 MG/ML SOLN
FINDINGS: CTA CHEST FINDINGS

Cardiovascular: Non contrasted sequence demonstrates aortic
atherosclerosis without intramural hematoma. Preferential
opacification of the thoracic aorta postcontrast administration. No
evidence of thoracic aortic aneurysm or dissection. No central
pulmonary embolus on this nondedicated study. Normal size heart. No
significant pericardial effusion/thickening. Normal heart size. No
pericardial effusion.

Mediastinum/Nodes: No suspicious thyroid nodule. No pathologically
enlarged mediastinal, hilar or axillary lymph nodes. The esophagus
is grossly unremarkable.

Lungs/Pleura: No suspicious pulmonary nodules or masses. No focal
airspace consolidation. No pleural effusion. No pneumothorax.

Musculoskeletal: Partially visualized cervical fusion hardware.
Thoracic spondylosis. No acute osseous abnormality.

Review of the MIP images confirms the above findings.

CTA ABDOMEN AND PELVIS FINDINGS

VASCULAR

Aorta: Aortic atherosclerosis. Normal caliber aorta without
aneurysm, dissection, vasculitis or significant stenosis.

Celiac: Atherosclerotic calcifications at the celiac ostia produce
mild stenosis.

SMA: Patent without evidence of aneurysm, dissection, vasculitis or
significant stenosis.

Renals: Single left and duplicated right renal arteries are patent
without evidence of aneurysm, dissection, vasculitis, fibromuscular
dysplasia or significant stenosis.

IMA: Patent without evidence of aneurysm, dissection, vasculitis or
significant stenosis.

Inflow: Patent without evidence of aneurysm, dissection, vasculitis
or significant stenosis.

Veins: No obvious venous abnormality within the limitations of this
arterial phase study.

Review of the MIP images confirms the above findings.

NON-VASCULAR

Hepatobiliary: No suspicious hepatic lesion. Gallbladder is
unremarkable. No biliary ductal dilation.

Pancreas: No pancreatic ductal dilation or evidence of acute
inflammation.

Spleen: No splenomegaly.

Adrenals/Urinary Tract: Bilateral adrenal glands are within normal
limits. Kidneys demonstrate symmetric enhancement. No
hydronephrosis. Urinary bladder is distended. Pelvic phleboliths.

Stomach/Bowel: No radiopaque enteric contrast material was
administered. Stomach is predominantly decompressed limiting
evaluation. There is mild wall thickening versus underdistention of
the gastric antrum and first part of the duodenum. No pathologic
dilation of large or small bowel. The appendix and terminal ileum
appear normal. Pancolonic diverticulosis without findings of acute
diverticulitis.

Lymphatic: No pathologically enlarged abdominal or pelvic lymph
nodes.

Reproductive: Uterus and bilateral adnexa are unremarkable.

Other: No significant abdominopelvic free fluid.

Musculoskeletal: Multilevel degenerative changes spine. Degenerative
changes bilateral hips and SI joints. Chronic changes at the pubic
symphysis.

Review of the MIP images confirms the above findings.
IMPRESSION: 1. No evidence of thoracic or abdominal aortic aneurysm or
dissection.
2. Mild wall thickening versus underdistention of the gastric antrum
and first part of the duodenum. Correlate for gastritis and/or
duodenitis.
3. Pancolonic diverticulosis without findings of acute
diverticulitis.
4.  Aortic Atherosclerosis ([id number]).

## 2022-08-30 ENCOUNTER — Encounter (HOSPITAL_BASED_OUTPATIENT_CLINIC_OR_DEPARTMENT_OTHER): Payer: Self-pay | Admitting: Emergency Medicine

## 2022-08-30 ENCOUNTER — Emergency Department (HOSPITAL_BASED_OUTPATIENT_CLINIC_OR_DEPARTMENT_OTHER)
Admission: EM | Admit: 2022-08-30 | Discharge: 2022-08-30 | Disposition: A | Discharge status: Home / Self Care | Payer: 59 | Attending: Emergency Medicine | Admitting: Emergency Medicine

## 2022-08-30 ENCOUNTER — Emergency Department (HOSPITAL_BASED_OUTPATIENT_CLINIC_OR_DEPARTMENT_OTHER): Payer: 59

## 2022-08-30 ENCOUNTER — Other Ambulatory Visit: Payer: Self-pay

## 2022-08-30 DIAGNOSIS — W19XXXA Unspecified fall, initial encounter: Secondary | ICD-10-CM | POA: Insufficient documentation

## 2022-08-30 DIAGNOSIS — Z8673 Personal history of transient ischemic attack (TIA), and cerebral infarction without residual deficits: Secondary | ICD-10-CM | POA: Diagnosis not present

## 2022-08-30 DIAGNOSIS — I10 Essential (primary) hypertension: Secondary | ICD-10-CM | POA: Insufficient documentation

## 2022-08-30 DIAGNOSIS — M25511 Pain in right shoulder: Secondary | ICD-10-CM | POA: Diagnosis present

## 2022-08-30 DIAGNOSIS — Z7982 Long term (current) use of aspirin: Secondary | ICD-10-CM | POA: Diagnosis not present

## 2022-08-30 NOTE — ED Provider Notes (Signed)
[facility] Provider Note   CSN: [id number] Arrival date & time: 08/30/22  2016     History  Chief Complaint  Patient presents with   Tina Herring    Tina Herring is a 62 y.o. female history of TIA and hypertension who presents after a fall in her driveway.  Patient was walking one of her dogs, and her dog started veering side-to-side which caused her to fall directly on her right arm.  She does not endorse any dizziness or syncopal episodes.  She did not lose consciousness.  She did not hit her head.  She denies any recent fevers, chest pain, shortness of breath.  She does not have any focal neurological deficits, besides having tingling in her right hand which she associates to her carpal tunnel.       Home Medications Prior to Admission medications   Medication Sig Start Date End Date Taking? Authorizing Provider  aspirin 81 MG tablet Take 81 mg by mouth daily.    [provider]  budesonide-formoterol (SYMBICORT) 160-4.5 MCG/ACT inhaler Inhale 2 puffs into the lungs 2 (two) times daily.    [provider]  chlorpheniramine-HYDROcodone (TUSSIONEX) 10-8 MG/5ML SUER Take 5 mLs by mouth every 12 (twelve) hours as needed. 06/10/18   [provider]  cyclobenzaprine (FLEXERIL) 10 MG tablet Take 1 tablet (10 mg total) by mouth 2 (two) times daily as needed for muscle spasms. 12/23/21   Gareth Morgan, MD  escitalopram (LEXAPRO) 20 MG tablet Take 20 mg by mouth daily. 03/28/18   [provider]  GARLIC PO Take 9,242 mg by mouth daily.     [provider]  lidocaine 4 % Place 1 patch onto the skin daily. 12/23/21   Gareth Morgan, MD  loratadine-pseudoephedrine (CLARITIN-D 24-HOUR) 10-240 MG 24 hr tablet Take 1 tablet by mouth daily.    [provider]  meclizine (ANTIVERT) 25 MG tablet Take 1 tablet (25 mg total) by mouth 3 (three) times daily as needed for dizziness. 08/16/19   Rancour, Annie Main, MD   Multiple Vitamin (MULTIVITAMIN WITH MINERALS) TABS tablet Take 1 tablet by mouth daily.    [provider]  Omega-3 Fatty Acids (FISH OIL) 1000 MG CAPS Take 1,000 mg by mouth daily.    [provider]  omeprazole (PRILOSEC) 20 MG capsule Take 20 mg by mouth daily.    [provider]  ondansetron (ZOFRAN ODT) 4 MG disintegrating tablet Take 1 tablet (4 mg total) by mouth every 8 (eight) hours as needed for nausea or vomiting. 08/16/19   Rancour, Annie Main, MD  oxyCODONE (ROXICODONE) 5 MG immediate release tablet Take 1 tablet (5 mg total) by mouth every 4 (four) hours as needed for severe pain. 12/23/21   Gareth Morgan, MD  VENTOLIN HFA 108 (90 Base) MCG/ACT inhaler Inhale 2 puffs into the lungs every 6 (six) hours as needed. 06/04/18   [provider]      Allergies    Shellfish allergy and Morphine and related    Review of Systems   Review of Systems  Physical Exam Updated Vital Signs BP (!) 155/99 (BP Location: Left Arm)   Pulse 76   Temp 97.9 F (36.6 C) (Oral)   Resp 18   Ht 5\' 2"  (1.575 m)   Wt 91.6 kg   SpO2 97%   BMI 36.95 kg/m  Physical Exam Cardiovascular:     Rate and Rhythm: Normal rate and regular rhythm.  Musculoskeletal:  General: Tenderness present.     Comments: Range of motion of right arm severely limited a pain.  Very tender to palpation.  Neurological:     General: No focal deficit present.     Comments: Limited range of motion of right arm.  Sensation intact in all extremities.  Grip strength 5 out of 5 in upper extremities.  Strength 5 out of 5 in lower extremities.     ED Results / Procedures / Treatments   Labs (all labs ordered are listed, but only abnormal results are displayed) Labs Reviewed - No data to display  EKG None  Radiology DG Shoulder Right  Result Date: 08/30/2022 CLINICAL DATA:  Fall with pain in right shoulder and down entire arm. EXAM: RIGHT SHOULDER - 2+ VIEW; RIGHT HUMERUS - 2+ VIEW  COMPARISON:  None Available. FINDINGS: Right shoulder: There is no evidence of fracture or dislocation. Mild degenerative changes are present at the acromioclavicular and glenohumeral joints. Soft tissues are unremarkable. Right humerus: No acute fracture or dislocation. Degenerative changes are present at the elbow. The soft tissues are within normal limits. IMPRESSION: No acute fracture or dislocation. Electronically Signed   By: Brett Fairy M.D.   On: 08/30/2022 21:12   DG Humerus Right  Result Date: 08/30/2022 CLINICAL DATA:  Fall with pain in right shoulder and down entire arm. EXAM: RIGHT SHOULDER - 2+ VIEW; RIGHT HUMERUS - 2+ VIEW COMPARISON:  None Available. FINDINGS: Right shoulder: There is no evidence of fracture or dislocation. Mild degenerative changes are present at the acromioclavicular and glenohumeral joints. Soft tissues are unremarkable. Right humerus: No acute fracture or dislocation. Degenerative changes are present at the elbow. The soft tissues are within normal limits. IMPRESSION: No acute fracture or dislocation. Electronically Signed   By: Brett Fairy M.D.   On: 08/30/2022 21:12   DG Wrist Complete Right  Result Date: 08/30/2022 CLINICAL DATA:  Fall EXAM: RIGHT WRIST - COMPLETE 3+ VIEW COMPARISON:  None Available. FINDINGS: Chondrocalcinosis. No acute bony abnormality. Specifically, no fracture, subluxation, or dislocation. Soft tissues are intact. IMPRESSION: No acute bony abnormality. Electronically Signed   By: Rolm Baptise M.D.   On: 08/30/2022 21:10     Medications Ordered in ED Medications - No data to display  ED Course/ Medical Decision Making/ A&P    Medical Decision Making Patient presents after a mechanical fall caused by her dog.  She denies any syncope, loss of consciousness.  Imaging of wrist, humerus, shoulder show no dislocation or acute fracture.  Patient states she already has pain medications at home including oxycodone, and Flexeril.  Advised  patient to start with Tylenol and ibuprofen for pain management, and if need be to move to Flexeril or oxycodone.  Also recommended patient to use ice as necessary. She was placed in a shoulder sling and was advised on how to take it off and on.  Recommended patient to keep shoulder and right arm safe, and avoid any other falls or causes of trauma.    Amount and/or Complexity of Data Reviewed Radiology:  Decision-making details documented in ED Course. ECG/medicine tests:  Decision-making details documented in ED Course.     Final Clinical Impression(s) / ED Diagnoses Final diagnoses:  Fall, initial encounter    Rx / DC Orders ED Discharge Orders     None         Lannis Lichtenwalner, Marlene Lard, MD 08/30/22 2251    Drenda Freeze, MD 08/30/22 [phone]

## 2022-08-30 NOTE — ED Triage Notes (Signed)
Pt fell in driveway tonight, landing on RUE; c/o pain from RT shoulder down entire arm

## 2022-08-30 NOTE — Discharge Instructions (Addendum)
He came in today complaining of some right arm pain after a fall.  Thankfully, your imaging shows no fractures or dislocations.  Likely sprains.  We are putting you in a sling, please use it while walking to help avoid pain keep the arm stabilized.  You can take it off and on as you please.  Please take Tylenol 500mg  every 4-6 hours or ibuprofen 600mg  every 6 hours.  You can also continue taking the oxycodone or the muscle relaxer (Flexeril).  You can also use the lidocaine patches that you already have, however remember they can only be used once a day.  Take care!
